# Patient Record
Sex: Female | Born: 1935 | Race: White | Marital: Married | Smoking: Never smoker
Health system: Southern US, Community
[De-identification: ages and names within clinical notes are randomized; demographics above are authoritative.]

## PROBLEM LIST (undated history)

## (undated) DIAGNOSIS — R51 Headache: Secondary | ICD-10-CM

## (undated) DIAGNOSIS — F419 Anxiety disorder, unspecified: Secondary | ICD-10-CM

## (undated) DIAGNOSIS — F329 Major depressive disorder, single episode, unspecified: Secondary | ICD-10-CM

## (undated) DIAGNOSIS — E119 Type 2 diabetes mellitus without complications: Secondary | ICD-10-CM

## (undated) DIAGNOSIS — I10 Essential (primary) hypertension: Secondary | ICD-10-CM

## (undated) DIAGNOSIS — G43909 Migraine, unspecified, not intractable, without status migrainosus: Secondary | ICD-10-CM

## (undated) DIAGNOSIS — E7800 Pure hypercholesterolemia, unspecified: Secondary | ICD-10-CM

## (undated) DIAGNOSIS — R55 Syncope and collapse: Secondary | ICD-10-CM

## (undated) DIAGNOSIS — H3530 Unspecified macular degeneration: Secondary | ICD-10-CM

## (undated) DIAGNOSIS — F32A Depression, unspecified: Secondary | ICD-10-CM

## (undated) HISTORY — DX: Essential (primary) hypertension: I10

## (undated) HISTORY — DX: Type 2 diabetes mellitus without complications: E11.9

## (undated) HISTORY — PX: ABDOMINAL HYSTERECTOMY: SHX81

## (undated) HISTORY — DX: Major depressive disorder, single episode, unspecified: F32.9

## (undated) HISTORY — PX: APPENDECTOMY: SHX54

## (undated) HISTORY — DX: Headache: R51

## (undated) HISTORY — DX: Migraine, unspecified, not intractable, without status migrainosus: G43.909

## (undated) HISTORY — PX: CATARACT EXTRACTION: SUR2

## (undated) HISTORY — DX: Depression, unspecified: F32.A

## (undated) HISTORY — DX: Unspecified macular degeneration: H35.30

## (undated) HISTORY — PX: CAROTID ARTERY ANGIOPLASTY: SHX1300

## (undated) HISTORY — DX: Syncope and collapse: R55

## (undated) HISTORY — DX: Pure hypercholesterolemia, unspecified: E78.00

## (undated) HISTORY — DX: Anxiety disorder, unspecified: F41.9

## (undated) HISTORY — PX: LUMBAR LAMINECTOMY: SHX95

---

## 2015-06-09 ENCOUNTER — Encounter: Payer: Self-pay | Admitting: Neurology

## 2015-06-09 ENCOUNTER — Ambulatory Visit (INDEPENDENT_AMBULATORY_CARE_PROVIDER_SITE_OTHER): Payer: Medicare Other | Admitting: Neurology

## 2015-06-09 VITALS — BP 102/60 | HR 64 | Ht 64.5 in | Wt 155.0 lb

## 2015-06-09 DIAGNOSIS — R51 Headache: Secondary | ICD-10-CM

## 2015-06-09 DIAGNOSIS — R519 Headache, unspecified: Secondary | ICD-10-CM

## 2015-06-09 DIAGNOSIS — G441 Vascular headache, not elsewhere classified: Secondary | ICD-10-CM

## 2015-06-09 DIAGNOSIS — R55 Syncope and collapse: Secondary | ICD-10-CM

## 2015-06-09 HISTORY — DX: Syncope and collapse: R55

## 2015-06-09 HISTORY — DX: Headache, unspecified: R51.9

## 2015-06-09 MED ORDER — GABAPENTIN 100 MG PO CAPS: ORAL_CAPSULE | ORAL | Status: DC

## 2015-06-09 NOTE — Progress Notes (Signed)
Reason for visit: Headache  Referring physician: Dr. Ernestine Mcmurray is a 82 y.o. female  History of present illness:  Ms. Zemanek is an 82 year old right-handed white female with a history of chronic daily headaches that dates back at least 10 years. The patient recalls as she was growing up as a teenager or a young adult that she was having some headaches off and on at that time. The headaches have been much more frequent over the last decade, but particularly over the last year the headaches have increased in severity. The headaches are bifrontal in nature, and may project backwards into the occipital area of the head. The patient denies any neck pain or neck stiffness. She reports some phonophobia with the headache, no photophobia. The patient denies any nausea or vomiting or any numbness or weakness with the headache. The patient does have some blurring of vision, but she also has bilateral macular degeneration which impairs her vision. She also reports issues with syncope that date back at least 2 years. The patient has had increasing frequency of syncope or near syncope. She indicates that when she lies down she feels better. The patient may feel lightheaded, then she may black out. She denies any diaphoresis or nausea with these events. The last such event was within the last 2 weeks prior to this evaluation. The patient has checked blood sugars around these events, they have not been too high or too low, running in the 130 to 150 range. The patient indicates that she had a head scan within the last 6 months at [facility]. She was told that this was unremarkable, but the report is not available to me. She does have a history of vertigo at times. She has a history of cerebrovascular disease, she has had a prior right carotid endarterectomy. The patient denies any problems controlling the bowels or the bladder, she denies any low back pain. She is sent to this office for an  evaluation. She claims that she takes only Tylenol for the headache, the headaches tend to be worse in the morning. She indicates that she has never been placed on a daily medication for her headache. She also goes on to say that she has a memory problem. She does not operate a motor vehicle.  Past Medical History  Diagnosis Date  . Hypertension   . Diabetes (HCC)   . High cholesterol   . Migraine   . Depression   . Anxiety   . Syncope and collapse 06/09/2015  . Headache 06/09/2015  . Macular degeneration     Past Surgical History  Procedure Laterality Date  . Abdominal hysterectomy    . Carotid artery angioplasty Right   . Cataract extraction Bilateral   . Appendectomy    . Lumbar laminectomy      Family History  Problem Relation Age of Onset  . Prostate cancer Father   . Heart disease Father   . Dementia Brother   . Emphysema Brother     Social history:  reports that she has never smoked. She does not have any smokeless tobacco history on file. She reports that she does not drink alcohol or use illicit drugs.  Medications:  Prior to Admission medications   Medication Sig Start Date End Date Taking? Authorizing Provider  beta carotene w/minerals (OCUVITE) tablet Take 1 tablet by mouth daily.   Yes Historical Provider, MD  Cholecalciferol (VITAMIN D-3 PO) Take 5,000 Int'l Units by mouth daily.  Yes Historical Provider, MD  gabapentin (NEURONTIN) 100 MG capsule One capsule three times a day for 2 weeks, then take 2 capsules three times a day 06/09/15   York Spaniel, MD  JANUVIA 100 MG tablet Take 100 mg by mouth daily. 03/20/15  Yes Historical Provider, MD  LANTUS SOLOSTAR 100 UNIT/ML Solostar Pen Inject 35 Units into the skin at bedtime. 05/12/15  Yes Historical Provider, MD  levothyroxine (SYNTHROID, LEVOTHROID) 50 MCG tablet TAKE 1 TABLET BY MOUTH DAILY ON EMPTY STOMACH WAIT 1 HOUR BEFORE AKING ANY OTHER MEDS/FOOD/DRINK 05/03/15  Yes Historical Provider, MD  lisinopril  (PRINIVIL,ZESTRIL) 40 MG tablet Take 5 mg by mouth daily. 03/20/15  Yes Historical Provider, MD  meclizine (ANTIVERT) 25 MG tablet Take 25 mg by mouth 2 (two) times daily as needed for dizziness.   Yes Historical Provider, MD  Plant Sterols and Stanols (CHOLESTOFF PO) Take by mouth daily.   Yes Historical Provider, MD     No Known Allergies  ROS:  Out of a complete 14 system review of symptoms, the patient complains only of the following symptoms, and all other reviewed systems are negative.  Fatigue Hearing loss, vertigo Moles Blurred vision, double vision, loss of vision, eye pain Shortness of breath Easy bruising Feeling cold, increased thirst Muscle cramps, aching muscles Memory loss, confusion, headache, weakness, slurred speech, dizziness, passing out, tremor Depression, not enough sleep, decreased energy, change in appetite Restless legs  Blood pressure 102/60, pulse 64, height 5' 4.5" (1.638 m), weight 155 lb (70.308 kg).  Physical Exam  General: The patient is alert and cooperative at the time of the examination.  Eyes: Pupils are equal, round, and reactive to light. Discs are flat bilaterally.  Neck: The neck is supple, no carotid bruits are noted on the right, a bruit is noted on the left.  Respiratory: The respiratory examination is clear.  Cardiovascular: The cardiovascular examination reveals a regular rate and rhythm, no obvious murmurs or rubs are noted.  Skin: Extremities are without significant edema.  Neurologic Exam  Mental status: The patient is alert and oriented x 3 at the time of the examination. The patient has apparent normal recent and remote memory, with an apparently normal attention span and concentration ability.  Cranial nerves: Facial symmetry is present. There is good sensation of the face to pinprick and soft touch bilaterally. The strength of the facial muscles and the muscles to head turning and shoulder shrug are normal bilaterally.  Speech is well enunciated, no aphasia or dysarthria is noted. Extraocular movements are full. Visual fields are full. The tongue is midline, and the patient has symmetric elevation of the soft palate. No obvious hearing deficits are noted.  Motor: The motor testing reveals 5 over 5 strength of all 4 extremities. Good symmetric motor tone is noted throughout.  Sensory: Sensory testing is intact to pinprick, soft touch, vibration sensation, and position sense on all 4 extremities, with the exception that there is some decreased vibration sensation on the right foot relative to the left. No evidence of extinction is noted.  Coordination: Cerebellar testing reveals good finger-nose-finger and heel-to-shin bilaterally.  Gait and station: Gait is wide-based, unsteady. The patient normally uses a cane or a walker. Tandem gait was not attempted. Romberg is negative. No drift is seen.  Reflexes: Deep tendon reflexes are symmetric in the arms bilaterally. With the lower extremity, the ankle jerk reflexes are depressed bilaterally, the left knee jerk reflexes depressed relative to the right. Toes are downgoing  bilaterally.   Assessment/Plan:  1. Chronic daily headache  2. History of cerebrovascular disease, left carotid bruit, prior right carotid endarterectomy  3. Recurrent syncope  4. Gait disturbance  5. Reported memory disturbance  The patient has had daily headaches for about 10 years, worse over the last one year. The patient indicates that she has had a head scan procedure done at [facility]. We will try to get the report of this. The patient will be placed on low-dose gabapentin for the headache, given the history of recurrent syncope and carotid bruit, the patient will be sent for a carotid Doppler study and a 2-D echocardiogram. She will have a 30 day cardiac monitor study. She will follow-up in 3 months.  Marlan Palau. Keith Adyn Hoes MD 06/09/2015 1:35 PM  [facility] [address] [place], [place] [id number]  Phone [phone] Fax [phone]

## 2015-06-09 NOTE — Patient Instructions (Signed)
Neurontin (gabapentin) may result in drowsiness, ankle swelling, gait instability, or possibly dizziness. Please contact our office if significant side effects occur with this medication.  Syncope Syncope is a medical term for fainting or passing out. This means you lose consciousness and drop to the ground. People are generally unconscious for less than 5 minutes. You may have some muscle twitches for up to 15 seconds before waking up and returning to normal. Syncope occurs more often in older adults, but it can happen to anyone. While most causes of syncope are not dangerous, syncope can be a sign of a serious medical problem. It is important to seek medical care.  CAUSES  Syncope is caused by a sudden drop in blood flow to the brain. The specific cause is often not determined. Factors that can bring on syncope include:  Taking medicines that lower blood pressure.  Sudden changes in posture, such as standing up quickly.  Taking more medicine than prescribed.  Standing in one place for too long.  Seizure disorders.  Dehydration and excessive exposure to heat.  Low blood sugar (hypoglycemia).  Straining to have a bowel movement.  Heart disease, irregular heartbeat, or other circulatory problems.  Fear, emotional distress, seeing blood, or severe pain. SYMPTOMS  Right before fainting, you may:  Feel dizzy or light-headed.  Feel nauseous.  See all white or all black in your field of vision.  Have cold, clammy skin. DIAGNOSIS  Your health care provider will ask about your symptoms, perform a physical exam, and perform an electrocardiogram (ECG) to record the electrical activity of your heart. Your health care provider may also perform other heart or blood tests to determine the cause of your syncope which may include:  Transthoracic echocardiogram (TTE). During echocardiography, sound waves are used to evaluate how blood flows through your heart.  Transesophageal  echocardiogram (TEE).  Cardiac monitoring. This allows your health care provider to monitor your heart rate and rhythm in real time.  Holter monitor. This is a portable device that records your heartbeat and can help diagnose heart arrhythmias. It allows your health care provider to track your heart activity for several days, if needed.  Stress tests by exercise or by giving medicine that makes the heart beat faster. TREATMENT  In most cases, no treatment is needed. Depending on the cause of your syncope, your health care provider may recommend changing or stopping some of your medicines. HOME CARE INSTRUCTIONS  Have someone stay with you until you feel stable.  Do not drive, use machinery, or play sports until your health care provider says it is okay.  Keep all follow-up appointments as directed by your health care provider.  Lie down right away if you start feeling like you might faint. Breathe deeply and steadily. Wait until all the symptoms have passed.  Drink enough fluids to keep your urine clear or pale yellow.  If you are taking blood pressure or heart medicine, get up slowly and take several minutes to sit and then stand. This can reduce dizziness. SEEK IMMEDIATE MEDICAL CARE IF:   You have a severe headache.  You have unusual pain in the chest, abdomen, or back.  You are bleeding from your mouth or rectum, or you have black or tarry stool.  You have an irregular or very fast heartbeat.  You have pain with breathing.  You have repeated fainting or seizure-like jerking during an episode.  You faint when sitting or lying down.  You have confusion.  You  have trouble walking.  You have severe weakness.  You have vision problems. If you fainted, call your local emergency services (911 in [place] Do not drive yourself to the hospital.    This information is not intended to replace advice given to you by your health care provider. Make sure you discuss any questions  you have with your health care provider.   Document Released: 01/16/2005 Document Revised: 06/02/2014 Document Reviewed: 03/17/2011 Elsevier Interactive Patient Education [employer]2016 [employer]vier Inc.

## 2015-06-11 ENCOUNTER — Telehealth: Payer: Self-pay

## 2015-06-11 NOTE — Telephone Encounter (Signed)
-----   Message from York Spanielharles K Willis, MD sent at 06/09/2015  1:40 PM EDT ----- Please contact [facility], have the most recent CT or MRI the brain scan report faxed to our office. Thank you.

## 2015-06-11 NOTE — Telephone Encounter (Signed)
Called [facility] medical records for most recent CT/MRI results. They asked for faxed request on office letter head. Request was faxed to [phone].

## 2015-06-14 ENCOUNTER — Telehealth: Payer: Self-pay | Admitting: Neurology

## 2015-06-14 NOTE — Telephone Encounter (Signed)
The results of the CT the head done on 12/03/[facility] reveals no acute process, some moderate cerebral atrophy and mild chronic small vessel disease was noted. Some minimal mucosal thickening at the inferior aspects of the maxillary sinuses is seen.

## 2015-06-24 ENCOUNTER — Other Ambulatory Visit: Payer: Self-pay | Admitting: Neurology

## 2015-06-24 ENCOUNTER — Other Ambulatory Visit: Payer: Self-pay

## 2015-06-24 ENCOUNTER — Ambulatory Visit (INDEPENDENT_AMBULATORY_CARE_PROVIDER_SITE_OTHER): Payer: Medicare Other

## 2015-06-24 ENCOUNTER — Ambulatory Visit (HOSPITAL_COMMUNITY)
Admission: RE | Admit: 2015-06-24 | Discharge: 2015-06-24 | Disposition: A | Discharge status: Home / Self Care | Payer: Medicare Other | Source: Ambulatory Visit | Attending: Cardiology | Admitting: Cardiology

## 2015-06-24 ENCOUNTER — Telehealth: Payer: Self-pay | Admitting: Neurology

## 2015-06-24 ENCOUNTER — Ambulatory Visit (HOSPITAL_BASED_OUTPATIENT_CLINIC_OR_DEPARTMENT_OTHER): Payer: Medicare Other

## 2015-06-24 DIAGNOSIS — R55 Syncope and collapse: Secondary | ICD-10-CM

## 2015-06-24 DIAGNOSIS — I340 Nonrheumatic mitral (valve) insufficiency: Secondary | ICD-10-CM | POA: Diagnosis not present

## 2015-06-24 DIAGNOSIS — G441 Vascular headache, not elsewhere classified: Secondary | ICD-10-CM

## 2015-06-24 DIAGNOSIS — I10 Essential (primary) hypertension: Secondary | ICD-10-CM | POA: Insufficient documentation

## 2015-06-24 DIAGNOSIS — E119 Type 2 diabetes mellitus without complications: Secondary | ICD-10-CM | POA: Insufficient documentation

## 2015-06-24 DIAGNOSIS — R0989 Other specified symptoms and signs involving the circulatory and respiratory systems: Secondary | ICD-10-CM

## 2015-06-24 DIAGNOSIS — I6523 Occlusion and stenosis of bilateral carotid arteries: Secondary | ICD-10-CM | POA: Diagnosis not present

## 2015-06-24 NOTE — Telephone Encounter (Signed)
  I called the daughter, the echocardiogram is unremarkable, the carotid Doppler study and a prolonged cardiac monitor study are still pending.  2-D echocardiogram 06/24/15:  Study Conclusions  - Left ventricle: The cavity size was normal. There was mild focal basal hypertrophy of the septum. Systolic function was normal. The estimated ejection fraction was in the range of 55% to 60%. Wall motion was normal; there were no regional wall motion abnormalities. - Aortic valve: Noncoronary cusp mobility was severely restricted. Valve area (VTI): 2.22 cm^2. Valve area (Vmax): 2.04 cm^2. Valve area (Vmean): 2.25 cm^2. - Mitral valve: There was mild to moderate regurgitation directed eccentrically and posteriorly. Valve area by continuity equation (using LVOT flow): 2.83 cm^2. - Atrial septum: No defect or patent foramen ovale was identified. - Pericardium, extracardiac: A trivial pericardial effusion was identified.

## 2015-06-25 ENCOUNTER — Telehealth: Payer: Self-pay | Admitting: Neurology

## 2015-06-25 NOTE — Telephone Encounter (Signed)
   I called, the carotid Doppler study does show bilateral stenosis, somewhat more prominent on the left, but the stenosis is not critical. We will follow for now.  Carotid Doppler study 06/24/15:  Carotid Doppler study shows 40-59% stenosis on the right, this the side of the carotid endarterectomy. Left side shows 60-79% stenosis, vertebral artery flow is normal.   We will follow the carotid Doppler, check in one year.

## 2015-07-29 ENCOUNTER — Telehealth: Payer: Self-pay | Admitting: Neurology

## 2015-07-29 NOTE — Telephone Encounter (Signed)
I called the patient, talk with the daughter. The patient had a cardiac monitor study that was unremarkable. The patient had 2 or 3 episodes of unresponsiveness while on the monitor, no correlate with a heart rhythm is seen. The patient is feeling some better with the headaches, still feels dizzy in the morning, with a headache on the top of the head. She is felt much better over the last 1 week. We will follow-up in 6 weeks, if the episodes continue, we may consider a low dose of Keppra as an empiric trial for the episodes of unresponsiveness.

## 2015-09-09 ENCOUNTER — Ambulatory Visit (INDEPENDENT_AMBULATORY_CARE_PROVIDER_SITE_OTHER): Payer: Medicare Other | Admitting: Neurology

## 2015-09-09 ENCOUNTER — Encounter: Payer: Self-pay | Admitting: Neurology

## 2015-09-09 VITALS — BP 172/78 | HR 65 | Ht 64.5 in | Wt 160.5 lb

## 2015-09-09 DIAGNOSIS — I779 Disorder of arteries and arterioles, unspecified: Secondary | ICD-10-CM

## 2015-09-09 DIAGNOSIS — I739 Peripheral vascular disease, unspecified: Secondary | ICD-10-CM

## 2015-09-09 DIAGNOSIS — R55 Syncope and collapse: Secondary | ICD-10-CM | POA: Diagnosis not present

## 2015-09-09 DIAGNOSIS — G441 Vascular headache, not elsewhere classified: Secondary | ICD-10-CM | POA: Diagnosis not present

## 2015-09-09 DIAGNOSIS — R413 Other amnesia: Secondary | ICD-10-CM | POA: Insufficient documentation

## 2015-09-09 MED ORDER — GABAPENTIN 300 MG PO CAPS: 300.0000 mg | ORAL_CAPSULE | Freq: Two times a day (BID) | ORAL | 3 refills | Status: DC

## 2015-09-09 NOTE — Patient Instructions (Signed)
   We will go up on the gabapentin to 300 mg twice a day. MRI of the brain will be done.

## 2015-09-09 NOTE — Progress Notes (Signed)
Reason for visit:  Headache, dizziness  Valerie Arnold is an 82 y.o. female  History of present illness:   Valerie Arnold is an 82 year old right-handed white female with a history of cerebrovascular disease, bilateral carotid artery stenosis. The patient has a greater than 10 year history of chronic daily headaches. She has reported increasing problems with dizziness , episodes of near-syncope and syncope. The patient has undergone a workup that has included a CT scan of the brain done at [facility], this was unremarkable. The patient has undergone carotid Doppler studies that show 40-59% stenosis on the right, 60-79 percent stenosis on the left. Vertebral artery flow appeared to be normal. The patient has undergone a 2-D echocardiogram that was unremarkable, and a cardiac event monitor showed no cardiac rhythm abnormalities even during episodes of syncope or near-syncope. The patient has had an occasional fall since last seen. The patient was placed on gabapentin for the headache which seems to be beneficial, but she only worked up to taking 200 mg twice daily. She returns to this office for an evaluation.  Past Medical History:  Diagnosis Date  . Anxiety   . Depression   . Diabetes (HCC)   . Headache 06/09/2015  . High cholesterol   . Hypertension   . Macular degeneration   . Migraine   . Syncope and collapse 06/09/2015    Past Surgical History:  Procedure Laterality Date  . ABDOMINAL HYSTERECTOMY    . APPENDECTOMY    . CAROTID ARTERY ANGIOPLASTY Right   . CATARACT EXTRACTION Bilateral   . LUMBAR LAMINECTOMY      Family History  Problem Relation Age of Onset  . Prostate cancer Father   . Heart disease Father   . Dementia Brother   . Emphysema Brother     Social history:  reports that she has never smoked. She has never used smokeless tobacco. She reports that she does not drink alcohol or use drugs.   No Known Allergies  Medications:  Prior to Admission  medications   Medication Sig Start Date End Date Taking? Authorizing Provider  beta carotene w/minerals (OCUVITE) tablet Take 1 tablet by mouth daily.   Yes Historical Provider, MD  Cholecalciferol (VITAMIN D-3 PO) Take 5,000 Int'l Units by mouth daily.   Yes Historical Provider, MD  gabapentin (NEURONTIN) 100 MG capsule One capsule three times a day for 2 weeks, then take 2 capsules three times a day 06/09/15  Yes York Spaniel, MD  JANUVIA 100 MG tablet Take 100 mg by mouth daily. 03/20/15  Yes Historical Provider, MD  LANTUS SOLOSTAR 100 UNIT/ML Solostar Pen Inject 35 Units into the skin at bedtime. 05/12/15  Yes Historical Provider, MD  levothyroxine (SYNTHROID, LEVOTHROID) 50 MCG tablet TAKE 1 TABLET BY MOUTH DAILY ON EMPTY STOMACH WAIT 1 HOUR BEFORE AKING ANY OTHER MEDS/FOOD/DRINK 05/03/15  Yes Historical Provider, MD  lisinopril (PRINIVIL,ZESTRIL) 5 MG tablet  07/30/15  Yes Historical Provider, MD  meclizine (ANTIVERT) 25 MG tablet Take 25 mg by mouth 2 (two) times daily as needed for dizziness.   Yes Historical Provider, MD  Plant Sterols and Stanols (CHOLESTOFF PO) Take by mouth daily.   Yes Historical Provider, MD    ROS:  Out of a complete 14 system review of symptoms, the patient complains only of the following symptoms, and all other reviewed systems are negative.   light sensitivity, loss of vision, blurred vision  Daytime sleepiness  Back pain, achy muscles, muscle cramps, walking difficulty  Moles  Memory loss, dizziness, headache, numbness, speech difficulty, weakness, tremors, passing out  Agitation , confusion, depression, anxiety, hallucinations  Blood pressure (!) 172/78, pulse 65, height 5' 4.5" (1.638 m), weight 160 lb 8 oz (72.8 kg).  Physical Exam  General: The patient is alert and cooperative at the time of the examination.  Skin: No significant peripheral edema is noted.   Neurologic Exam  Mental status: The patient is alert and oriented x 2 at the time of  the examination ( not oriented to date). The  Mini-Mental status examination done today shows a total score 23/30.   Cranial nerves: Facial symmetry is present. Speech is normal, no aphasia or dysarthria is noted. Extraocular movements are full. Visual fields are full, but the patient has poor macular vision.  Motor: The patient has good strength in all 4 extremities.  Sensory examination: Soft touch sensation is symmetric on the face, arms, and legs.  Coordination: The patient has good finger-nose-finger and heel-to-shin bilaterally.  Gait and station: The patient has a wide-based gait, the patient walks with a cane or a walker. Romberg is negative. Tandem gait was not attempted.  Reflexes: Deep tendon reflexes are symmetric.   Assessment/Plan:   1. Chronic daily headache   2. Dizziness, near syncope and syncope   3. Gait disturbance   4. Cerebrovascular disease, bilateral carotid stenosis   5. Memory disturbance   The patient will be sent back for MRI evaluation of the brain. The gabapentin will be increased to 300 milligrams twice daily. The patient will follow-up in 4 months for an evaluation. We will follow the carotid Doppler study over time. The memory will be followed over time as well.  Valerie Palau. Keith Kemani Demarais MD 09/09/2015 7:17 PM  [facility] [address] [place], [place] [id number]  Phone [phone] Fax [phone]

## 2015-09-13 DIAGNOSIS — E785 Hyperlipidemia, unspecified: Secondary | ICD-10-CM | POA: Insufficient documentation

## 2015-09-13 DIAGNOSIS — E559 Vitamin D deficiency, unspecified: Secondary | ICD-10-CM | POA: Insufficient documentation

## 2015-09-13 DIAGNOSIS — E039 Hypothyroidism, unspecified: Secondary | ICD-10-CM | POA: Insufficient documentation

## 2015-09-13 DIAGNOSIS — N182 Chronic kidney disease, stage 2 (mild): Secondary | ICD-10-CM | POA: Insufficient documentation

## 2015-09-13 DIAGNOSIS — Z794 Long term (current) use of insulin: Secondary | ICD-10-CM | POA: Insufficient documentation

## 2015-09-13 DIAGNOSIS — E1165 Type 2 diabetes mellitus with hyperglycemia: Secondary | ICD-10-CM | POA: Insufficient documentation

## 2016-02-04 ENCOUNTER — Ambulatory Visit: Payer: Medicare Other | Admitting: Neurology

## 2016-02-27 DIAGNOSIS — N179 Acute kidney failure, unspecified: Secondary | ICD-10-CM

## 2016-02-27 DIAGNOSIS — E039 Hypothyroidism, unspecified: Secondary | ICD-10-CM

## 2016-02-27 DIAGNOSIS — N390 Urinary tract infection, site not specified: Secondary | ICD-10-CM

## 2016-02-27 DIAGNOSIS — I4510 Unspecified right bundle-branch block: Secondary | ICD-10-CM

## 2016-02-27 DIAGNOSIS — I214 Non-ST elevation (NSTEMI) myocardial infarction: Secondary | ICD-10-CM

## 2016-02-27 DIAGNOSIS — E872 Acidosis: Secondary | ICD-10-CM

## 2016-02-27 DIAGNOSIS — E785 Hyperlipidemia, unspecified: Secondary | ICD-10-CM

## 2016-02-27 DIAGNOSIS — E860 Dehydration: Secondary | ICD-10-CM

## 2016-02-27 DIAGNOSIS — R748 Abnormal levels of other serum enzymes: Secondary | ICD-10-CM | POA: Diagnosis not present

## 2016-02-27 DIAGNOSIS — I252 Old myocardial infarction: Secondary | ICD-10-CM

## 2016-02-27 DIAGNOSIS — G9349 Other encephalopathy: Secondary | ICD-10-CM

## 2016-02-27 DIAGNOSIS — I10 Essential (primary) hypertension: Secondary | ICD-10-CM

## 2016-02-27 DIAGNOSIS — Z87898 Personal history of other specified conditions: Secondary | ICD-10-CM

## 2016-02-28 DIAGNOSIS — I214 Non-ST elevation (NSTEMI) myocardial infarction: Secondary | ICD-10-CM | POA: Diagnosis not present

## 2016-02-28 DIAGNOSIS — N390 Urinary tract infection, site not specified: Secondary | ICD-10-CM | POA: Diagnosis not present

## 2016-02-28 DIAGNOSIS — R748 Abnormal levels of other serum enzymes: Secondary | ICD-10-CM | POA: Diagnosis not present

## 2016-02-28 DIAGNOSIS — G9349 Other encephalopathy: Secondary | ICD-10-CM | POA: Diagnosis not present

## 2016-02-29 DIAGNOSIS — I214 Non-ST elevation (NSTEMI) myocardial infarction: Secondary | ICD-10-CM | POA: Diagnosis not present

## 2016-02-29 DIAGNOSIS — R748 Abnormal levels of other serum enzymes: Secondary | ICD-10-CM | POA: Diagnosis not present

## 2016-02-29 DIAGNOSIS — N390 Urinary tract infection, site not specified: Secondary | ICD-10-CM | POA: Diagnosis not present

## 2016-02-29 DIAGNOSIS — G9349 Other encephalopathy: Secondary | ICD-10-CM | POA: Diagnosis not present

## 2016-03-01 ENCOUNTER — Encounter (HOSPITAL_COMMUNITY): Payer: Self-pay | Admitting: Family Medicine

## 2016-03-01 ENCOUNTER — Inpatient Hospital Stay (HOSPITAL_COMMUNITY)
Admission: EM | Admit: 2016-03-01 | Discharge: 2016-03-06 | DRG: 064 | Disposition: A | Discharge status: Skilled Nursing Facility | Payer: Medicare Other | Source: Other Acute Inpatient Hospital | Attending: Internal Medicine | Admitting: Internal Medicine

## 2016-03-01 DIAGNOSIS — J690 Pneumonitis due to inhalation of food and vomit: Secondary | ICD-10-CM | POA: Diagnosis present

## 2016-03-01 DIAGNOSIS — E039 Hypothyroidism, unspecified: Secondary | ICD-10-CM | POA: Diagnosis not present

## 2016-03-01 DIAGNOSIS — I639 Cerebral infarction, unspecified: Secondary | ICD-10-CM | POA: Diagnosis not present

## 2016-03-01 DIAGNOSIS — I10 Essential (primary) hypertension: Secondary | ICD-10-CM | POA: Diagnosis not present

## 2016-03-01 DIAGNOSIS — I6340 Cerebral infarction due to embolism of unspecified cerebral artery: Secondary | ICD-10-CM | POA: Diagnosis present

## 2016-03-01 DIAGNOSIS — E1122 Type 2 diabetes mellitus with diabetic chronic kidney disease: Secondary | ICD-10-CM | POA: Diagnosis present

## 2016-03-01 DIAGNOSIS — E876 Hypokalemia: Secondary | ICD-10-CM | POA: Diagnosis not present

## 2016-03-01 DIAGNOSIS — N390 Urinary tract infection, site not specified: Secondary | ICD-10-CM | POA: Diagnosis present

## 2016-03-01 DIAGNOSIS — I959 Hypotension, unspecified: Secondary | ICD-10-CM | POA: Diagnosis present

## 2016-03-01 DIAGNOSIS — R5381 Other malaise: Secondary | ICD-10-CM | POA: Diagnosis present

## 2016-03-01 DIAGNOSIS — I6789 Other cerebrovascular disease: Secondary | ICD-10-CM | POA: Diagnosis not present

## 2016-03-01 DIAGNOSIS — I214 Non-ST elevation (NSTEMI) myocardial infarction: Secondary | ICD-10-CM | POA: Diagnosis present

## 2016-03-01 DIAGNOSIS — I779 Disorder of arteries and arterioles, unspecified: Secondary | ICD-10-CM | POA: Diagnosis not present

## 2016-03-01 DIAGNOSIS — Z66 Do not resuscitate: Secondary | ICD-10-CM | POA: Diagnosis present

## 2016-03-01 DIAGNOSIS — F329 Major depressive disorder, single episode, unspecified: Secondary | ICD-10-CM | POA: Diagnosis present

## 2016-03-01 DIAGNOSIS — E118 Type 2 diabetes mellitus with unspecified complications: Secondary | ICD-10-CM

## 2016-03-01 DIAGNOSIS — R079 Chest pain, unspecified: Secondary | ICD-10-CM | POA: Diagnosis not present

## 2016-03-01 DIAGNOSIS — R0602 Shortness of breath: Secondary | ICD-10-CM

## 2016-03-01 DIAGNOSIS — R748 Abnormal levels of other serum enzymes: Secondary | ICD-10-CM | POA: Diagnosis not present

## 2016-03-01 DIAGNOSIS — G9340 Encephalopathy, unspecified: Secondary | ICD-10-CM | POA: Diagnosis present

## 2016-03-01 DIAGNOSIS — I4891 Unspecified atrial fibrillation: Secondary | ICD-10-CM | POA: Diagnosis present

## 2016-03-01 DIAGNOSIS — Z79899 Other long term (current) drug therapy: Secondary | ICD-10-CM

## 2016-03-01 DIAGNOSIS — H3530 Unspecified macular degeneration: Secondary | ICD-10-CM | POA: Diagnosis present

## 2016-03-01 DIAGNOSIS — E119 Type 2 diabetes mellitus without complications: Secondary | ICD-10-CM

## 2016-03-01 DIAGNOSIS — I6523 Occlusion and stenosis of bilateral carotid arteries: Secondary | ICD-10-CM | POA: Diagnosis present

## 2016-03-01 DIAGNOSIS — I480 Paroxysmal atrial fibrillation: Secondary | ICD-10-CM | POA: Diagnosis present

## 2016-03-01 DIAGNOSIS — I358 Other nonrheumatic aortic valve disorders: Secondary | ICD-10-CM | POA: Diagnosis not present

## 2016-03-01 DIAGNOSIS — B3789 Other sites of candidiasis: Secondary | ICD-10-CM | POA: Diagnosis present

## 2016-03-01 DIAGNOSIS — I129 Hypertensive chronic kidney disease with stage 1 through stage 4 chronic kidney disease, or unspecified chronic kidney disease: Secondary | ICD-10-CM | POA: Diagnosis present

## 2016-03-01 DIAGNOSIS — Y95 Nosocomial condition: Secondary | ICD-10-CM

## 2016-03-01 DIAGNOSIS — I2510 Atherosclerotic heart disease of native coronary artery without angina pectoris: Secondary | ICD-10-CM | POA: Diagnosis present

## 2016-03-01 DIAGNOSIS — R339 Retention of urine, unspecified: Secondary | ICD-10-CM | POA: Diagnosis not present

## 2016-03-01 DIAGNOSIS — F419 Anxiety disorder, unspecified: Secondary | ICD-10-CM | POA: Diagnosis present

## 2016-03-01 DIAGNOSIS — I739 Peripheral vascular disease, unspecified: Secondary | ICD-10-CM

## 2016-03-01 DIAGNOSIS — B370 Candidal stomatitis: Secondary | ICD-10-CM | POA: Diagnosis present

## 2016-03-01 DIAGNOSIS — J189 Pneumonia, unspecified organism: Secondary | ICD-10-CM | POA: Diagnosis not present

## 2016-03-01 DIAGNOSIS — E785 Hyperlipidemia, unspecified: Secondary | ICD-10-CM | POA: Diagnosis present

## 2016-03-01 DIAGNOSIS — N182 Chronic kidney disease, stage 2 (mild): Secondary | ICD-10-CM | POA: Diagnosis not present

## 2016-03-01 DIAGNOSIS — Z794 Long term (current) use of insulin: Secondary | ICD-10-CM | POA: Diagnosis not present

## 2016-03-01 DIAGNOSIS — R1311 Dysphagia, oral phase: Secondary | ICD-10-CM | POA: Diagnosis present

## 2016-03-01 DIAGNOSIS — I2583 Coronary atherosclerosis due to lipid rich plaque: Secondary | ICD-10-CM | POA: Diagnosis not present

## 2016-03-01 DIAGNOSIS — R4182 Altered mental status, unspecified: Secondary | ICD-10-CM | POA: Diagnosis not present

## 2016-03-01 DIAGNOSIS — R1310 Dysphagia, unspecified: Secondary | ICD-10-CM

## 2016-03-01 DIAGNOSIS — G9349 Other encephalopathy: Secondary | ICD-10-CM | POA: Diagnosis not present

## 2016-03-01 LAB — GLUCOSE, CAPILLARY: Glucose-Capillary: 203 mg/dL — ABNORMAL HIGH (ref 65–99)

## 2016-03-01 MED ORDER — STROKE: EARLY STAGES OF RECOVERY BOOK
Freq: Once | Status: AC
  Administered 2016-03-02

## 2016-03-01 MED ORDER — LEVOTHYROXINE SODIUM 50 MCG PO TABS
50.0000 ug | ORAL_TABLET | Freq: Every day | ORAL | Status: DC
  Administered 2016-03-03 – 2016-03-06 (×4): 50 ug via ORAL
  Filled 2016-03-01 (×4): qty 1

## 2016-03-01 MED ORDER — MORPHINE SULFATE (PF) 2 MG/ML IV SOLN: 1.0000 mg | INTRAVENOUS | Status: DC | PRN

## 2016-03-01 MED ORDER — MECLIZINE HCL 12.5 MG PO TABS: 25.0000 mg | ORAL_TABLET | Freq: Two times a day (BID) | ORAL | Status: DC | PRN

## 2016-03-01 MED ORDER — PLANT STEROLS AND STANOLS 450 MG PO TABS: 1.0000 | ORAL_TABLET | Freq: Every day | ORAL | Status: DC

## 2016-03-01 MED ORDER — PRAVASTATIN SODIUM 20 MG PO TABS: 20.0000 mg | ORAL_TABLET | Freq: Every day | ORAL | Status: DC

## 2016-03-01 MED ORDER — INSULIN GLARGINE 100 UNIT/ML ~~LOC~~ SOLN
30.0000 [IU] | Freq: Every day | SUBCUTANEOUS | Status: DC
  Administered 2016-03-02 – 2016-03-03 (×3): 30 [IU] via SUBCUTANEOUS
  Filled 2016-03-01 (×3): qty 0.3

## 2016-03-01 MED ORDER — ACETAMINOPHEN 650 MG RE SUPP
650.0000 mg | RECTAL | Status: DC | PRN
  Administered 2016-03-02: 650 mg via RECTAL
  Filled 2016-03-01: qty 1

## 2016-03-01 MED ORDER — ACETAMINOPHEN 325 MG PO TABS: 650.0000 mg | ORAL_TABLET | ORAL | Status: DC | PRN

## 2016-03-01 MED ORDER — NYSTATIN 100000 UNIT/ML MT SUSP
5.0000 mL | Freq: Four times a day (QID) | OROMUCOSAL | Status: DC
  Administered 2016-03-02 – 2016-03-06 (×17): 500000 [IU] via OROMUCOSAL
  Filled 2016-03-01 (×20): qty 5

## 2016-03-01 MED ORDER — GABAPENTIN 300 MG PO CAPS
300.0000 mg | ORAL_CAPSULE | Freq: Two times a day (BID) | ORAL | Status: DC
  Administered 2016-03-02 – 2016-03-06 (×9): 300 mg via ORAL
  Filled 2016-03-01 (×9): qty 1

## 2016-03-01 MED ORDER — ACETAMINOPHEN 160 MG/5ML PO SOLN: 650.0000 mg | ORAL | Status: DC | PRN

## 2016-03-01 MED ORDER — FLUCONAZOLE IN SODIUM CHLORIDE 400-0.9 MG/200ML-% IV SOLN
400.0000 mg | Freq: Once | INTRAVENOUS | Status: AC
  Administered 2016-03-02: 400 mg via INTRAVENOUS
  Filled 2016-03-01: qty 200

## 2016-03-01 MED ORDER — ASPIRIN 325 MG PO TABS
325.0000 mg | ORAL_TABLET | Freq: Every day | ORAL | Status: DC
  Administered 2016-03-02 – 2016-03-03 (×2): 325 mg via ORAL
  Filled 2016-03-01 (×2): qty 1

## 2016-03-01 MED ORDER — ENOXAPARIN SODIUM 40 MG/0.4ML ~~LOC~~ SOLN: 40.0000 mg | SUBCUTANEOUS | Status: DC

## 2016-03-01 MED ORDER — INSULIN ASPART 100 UNIT/ML ~~LOC~~ SOLN
0.0000 [IU] | SUBCUTANEOUS | Status: DC
  Administered 2016-03-02: 3 [IU] via SUBCUTANEOUS
  Administered 2016-03-02: 5 [IU] via SUBCUTANEOUS
  Administered 2016-03-03: 2 [IU] via SUBCUTANEOUS
  Administered 2016-03-03 – 2016-03-04 (×3): 1 [IU] via SUBCUTANEOUS
  Administered 2016-03-05: 3 [IU] via SUBCUTANEOUS
  Administered 2016-03-05: 2 [IU] via SUBCUTANEOUS
  Administered 2016-03-06: 3 [IU] via SUBCUTANEOUS
  Administered 2016-03-06: 2 [IU] via SUBCUTANEOUS

## 2016-03-01 MED ORDER — VITAMIN D 1000 UNITS PO TABS
5000.0000 [IU] | ORAL_TABLET | Freq: Every day | ORAL | Status: DC
  Administered 2016-03-02 – 2016-03-06 (×5): 5000 [IU] via ORAL
  Filled 2016-03-01 (×6): qty 5

## 2016-03-01 MED ORDER — SENNOSIDES-DOCUSATE SODIUM 8.6-50 MG PO TABS: 1.0000 | ORAL_TABLET | Freq: Every evening | ORAL | Status: DC | PRN

## 2016-03-01 MED ORDER — SODIUM CHLORIDE 0.9 % IV SOLN
INTRAVENOUS | Status: DC
  Administered 2016-03-01: via INTRAVENOUS

## 2016-03-01 NOTE — H&P (Signed)
History and Physical    Valerie Arnold ZOX:[id number] DOB: Jun 29, 1927 DOA: 03/01/2016  PCP: Anselmo Pickler, MD   Patient coming from: [facility]   Chief Complaint: Ischemic stroke  HPI: Valerie Arnold is a 83 y.o. female with medical history significant for insulin-dependent diabetes mellitus, anxiety, depression, and hypertension who presented to [facility] emergency department on 02/27/2016 for evaluation of several days of generalized weakness, altered mental status, and hypotension.  She was found to have a troponin in the 6-range and UTI. Cardiology was consulted and the patient was anticoagulated with Lovenox with a conservative approach favored by cardiology there. Troponin trended back down and patient was eventually transitioned to subcutaneous Lovenox and was started on aspirin, beta blocker, statin, and ranolazine.   Patient's mental status change did not improve with treatment of her UTI and MRI of the brain was performed on 02/28/16, revealing an acute 3-4 mm infarct in the right frontoparietal junction without hemorrhage or mass effect. Neurology was consulted and it was advised that the patient be treated with aspirin for now, and then changed to Plavix once she is more stable. During evaluation of the patient's CVA, carotid Dopplers were performed, revealing high-grade left carotid bifurcation stenosis involving the proximal ICA with near occlusion, as well as mild diffuse disease on the right. Vascular surgery at [facility] was consulted regarding the high-grade carotid stenosis, but it was advised that the patient will need to wait at least 2-3 weeks for consideration of surgery given the acute MI and acute stroke.   Patient was under the care of hospitalists at [facility] with neurology consulted by telemedicine. Patient's family requested transfer to [facility] for on-site neurology. Transfer was arranged on 02/28/2016, but bed was not immediately  available and the patient continued to wait at [facility].   On 02/29/2016, patient had acute respiratory symptoms and chest x-ray revealed new opacity in the right lower lobe consistent with pneumonia or aspiration. Patient's daughter at the bedside and notes that there has been difficulty with swallowing and the patient has been choking. Patient was treated with Zosyn for the suspected aspiration pneumonia and the UTI. Admission was also notable for development of oropharyngeal candidiasis treated with IV Diflucan and oral nystatin.  Review of Systems:  Unable to obtain complete ROS secondary to the clinical condition.  Past Medical History:  Diagnosis Date  . Anxiety   . Depression   . Diabetes (HCC)   . Headache 06/09/2015  . High cholesterol   . Hypertension   . Macular degeneration   . Migraine   . Syncope and collapse 06/09/2015    Past Surgical History:  Procedure Laterality Date  . ABDOMINAL HYSTERECTOMY    . APPENDECTOMY    . CAROTID ARTERY ANGIOPLASTY Right   . CATARACT EXTRACTION Bilateral   . LUMBAR LAMINECTOMY       reports that she has never smoked. She has never used smokeless tobacco. She reports that she does not drink alcohol or use drugs.  No Known Allergies  Family History  Problem Relation Age of Onset  . Prostate cancer Father   . Heart disease Father   . Dementia Brother   . Emphysema Brother      Prior to Admission medications   Medication Sig Start Date End Date Taking? Authorizing Provider  beta carotene w/minerals (OCUVITE) tablet Take 1 tablet by mouth daily.    Historical Provider, MD  Cholecalciferol (VITAMIN D-3 PO) Take 5,000 Int'l Units by mouth daily.  Historical Provider, MD  gabapentin (NEURONTIN) 300 MG capsule Take 1 capsule (300 mg total) by mouth 2 (two) times daily. 09/09/15   York Spaniel, MD  JANUVIA 100 MG tablet Take 100 mg by mouth daily. 03/20/15   Historical Provider, MD  LANTUS SOLOSTAR 100 UNIT/ML Solostar Pen  Inject 35 Units into the skin at bedtime. 05/12/15   Historical Provider, MD  levothyroxine (SYNTHROID, LEVOTHROID) 50 MCG tablet TAKE 1 TABLET BY MOUTH DAILY ON EMPTY STOMACH WAIT 1 HOUR BEFORE AKING ANY OTHER MEDS/FOOD/DRINK 05/03/15   Historical Provider, MD  lisinopril (PRINIVIL,ZESTRIL) 5 MG tablet  07/30/15   Historical Provider, MD  meclizine (ANTIVERT) 25 MG tablet Take 25 mg by mouth 2 (two) times daily as needed for dizziness.    Historical Provider, MD  Plant Sterols and Stanols (CHOLESTOFF PO) Take by mouth daily.    Historical Provider, MD    Physical Exam: Vitals:   03/01/16 2143  BP: (!) 151/56  Pulse: 90  Resp: 20  Temp: 99.1 F (37.3 C)  TempSrc: Oral  SpO2: 94%  Weight: 76.5 kg (168 lb 9.6 oz)  Height: 5\' 4"  (1.626 m)      Constitutional: No acute distress, calm, comfortable Eyes: PERTLA, lids and conjunctivae normal ENMT: Mucous membranes are dry. Posterior pharynx clear of any exudate or lesions.   Neck: normal, supple, no masses, no thyromegaly Respiratory: Coarse upper airways sounds, rhonchi in bilateral bases. Normal respiratory effort. No accessory muscle use.  Cardiovascular: S1 & S2 heard, regular rate and rhythm, no significant murmur. No significant JVD. Abdomen: No distension, no tenderness, no masses palpated. Bowel sounds normal.  Musculoskeletal: no clubbing / cyanosis. No joint deformity upper and lower extremities.   Skin: no significant rashes, lesions, ulcers. Warm, dry, well-perfused. Neurologic: No gross facial asymmetry, moving all extremities spontaneously. Responds to very simple questioning only. Has difficulty following commands. Patellar DTR normal bilaterally. Babinski down-going bilaterally.  Psychiatric: Difficult to assess given the clinical condition post-CVA.     Labs on Admission: I have personally reviewed following labs and imaging studies  CBC: No results for input(s): WBC, NEUTROABS, HGB, HCT, MCV, PLT in the last 168  hours. Basic Metabolic Panel: No results for input(s): NA, K, CL, CO2, GLUCOSE, BUN, CREATININE, CALCIUM, MG, PHOS in the last 168 hours. GFR: CrCl cannot be calculated (No order found.). Liver Function Tests: No results for input(s): AST, ALT, ALKPHOS, BILITOT, PROT, ALBUMIN in the last 168 hours. No results for input(s): LIPASE, AMYLASE in the last 168 hours. No results for input(s): AMMONIA in the last 168 hours. Coagulation Profile: No results for input(s): INR, PROTIME in the last 168 hours. Cardiac Enzymes: No results for input(s): CKTOTAL, CKMB, CKMBINDEX, TROPONINI in the last 168 hours. BNP (last 3 results) No results for input(s): PROBNP in the last 8760 hours. HbA1C: No results for input(s): HGBA1C in the last 72 hours. CBG: No results for input(s): GLUCAP in the last 168 hours. Lipid Profile: No results for input(s): CHOL, HDL, LDLCALC, TRIG, CHOLHDL, LDLDIRECT in the last 72 hours. Thyroid Function Tests: No results for input(s): TSH, T4TOTAL, FREET4, T3FREE, THYROIDAB in the last 72 hours. Anemia Panel: No results for input(s): VITAMINB12, FOLATE, FERRITIN, TIBC, IRON, RETICCTPCT in the last 72 hours. Urine analysis: No results found for: COLORURINE, APPEARANCEUR, LABSPEC, PHURINE, GLUCOSEU, HGBUR, BILIRUBINUR, KETONESUR, PROTEINUR, UROBILINOGEN, NITRITE, LEUKOCYTESUR Sepsis Labs: @LABRCNTIP (procalcitonin:4,lacticidven:4) )No results found for this or any previous visit (from the past 240 hour(s)).   Radiological Exams on Admission: No  results found.  EKG: Ordered and pending.   Assessment/Plan  1. Ischemic stroke  - Pt presented to OSH on 1/28 with several days of gen weakness and confusion  - Head CT was negative on 02/27/15 and mental-status changes attributed to UTI  - She continued to worsen despite treatment of the UTI and MRI brain obtained 02/28/16 demonstrates acute 3-4 mm ischemic infarct at right frontoparietal jxn without hemorrhage or mass-effect  -  Pt was evaluated by teleneurology at the OSH, started on ASA, advised to switch to Plavix once more stable  - PT, OT, SLP evals requested - Continue ASA and statin pending SLP eval   2. Carotid artery stenosis  - Hx of right CEA, now with near-occlusion on left per dopplers 02/28/16 - Vasc surgery was consulted at OSH and advised she would not be considered for surgery in the next 2-3 wks given she had just suffered NSTEMI - Continue medical management for now    3. Hospital-acquired PNA   - Pt had clear CXR and no respiratory complaints [facility] - On 02/29/2016, she was dyspneic and coughing, requiring supplemental O2, and CXR featured a new RLL infiltrate suspicious for aspiration pneumonia  - She was treated with Zosyn at the OSH  - She is afebrile here, saturating adequately with 2 Lpm, not in respiratory distress  - SLP eval requested, pt kept NPO for now, continue empiric abx   4. CAD   - Pt suffered non-STEMI at the outside hospital, troponin peaking in 6-range before coming down  - She was evaluated by cardiology at the OSH, treated conservatively with treatment-dose Lovenox, ASA, statin, beta-blocker, and nitrates  - There is no anginal complaint on admission here  - She is being monitored on telemetry  - Continue ASA, statin, beta-blocker, nitrates   5. Insulin-dependent DM  - No A1c on file  - Managed with Januvia and Lantus 35 units qHS at home  - Check CBG q4h until she is appropriate for a diet  - Continue Lantus at 30 units qHS with a low-intensity sliding-scale correctional    6. Hypothyroidism  - TSH was wnl at the OSH  - Continue Synthroid   7. Hypertension  - BP at goal on admission  - Continue beta-blocker     DVT prophylaxis: sq Lovenox  Code Status: DNR  Family Communication: Daughter updated at bedside Disposition Plan: Admit to telemetry Consults called: None Admission status: Inpatient    Briscoe Deutscherimothy S Opyd, MD [facility] Pager [phone]  If 7PM-7AM, please contact night-coverage [email or web address] Password [email or web address]  03/01/2016, 11:39 PM

## 2016-03-01 NOTE — Progress Notes (Signed)
Pharmacy Antibiotic Note  Valerie MylarMargie Arnold is a 83 y.o. female [facility] with stroke, concern for pneumonia.  Pharmacy has been consulted for Vancocin and Zosyn dosing.  Pt [facility], changed to Zosyn for aspiration coverage.  [facility] notes mention 1/2 blood cx w/ GPC in clusters.  Labs from OSH: WBC 12.1, Hgb 11.6, Plt 126, SCr 0.9, K 3.8, trop   Home meds: ASA, Ocuvite, Lantus, Synthroid, meclizine, Neurontin, metformin  Plan: Vancomycin 750mg  IV every 12 hours.  Goal trough 15-20 mcg/mL. Zosyn 3.375g IV q8h (4 hour infusion).  Height: 5\' 4"  (162.6 cm) Weight: 168 lb 9.6 oz (76.5 kg) IBW/kg (Calculated) : 54.7  Temp (24hrs), Avg:99.1 F (37.3 C), Min:99.1 F (37.3 C), Max:99.1 F (37.3 C)  No Known Allergies  Thank you for allowing pharmacy to be a part of this patient's care.  Valerie GamblesVeronda Airis Arnold, PharmD, BCPS  03/01/2016 11:49 PM

## 2016-03-02 ENCOUNTER — Encounter (HOSPITAL_COMMUNITY): Payer: Self-pay | Admitting: Family Medicine

## 2016-03-02 ENCOUNTER — Inpatient Hospital Stay (HOSPITAL_COMMUNITY): Payer: Medicare Other

## 2016-03-02 DIAGNOSIS — R4182 Altered mental status, unspecified: Secondary | ICD-10-CM

## 2016-03-02 DIAGNOSIS — J189 Pneumonia, unspecified organism: Secondary | ICD-10-CM

## 2016-03-02 DIAGNOSIS — I779 Disorder of arteries and arterioles, unspecified: Secondary | ICD-10-CM

## 2016-03-02 DIAGNOSIS — J690 Pneumonitis due to inhalation of food and vomit: Secondary | ICD-10-CM

## 2016-03-02 DIAGNOSIS — I2510 Atherosclerotic heart disease of native coronary artery without angina pectoris: Secondary | ICD-10-CM

## 2016-03-02 DIAGNOSIS — I639 Cerebral infarction, unspecified: Secondary | ICD-10-CM

## 2016-03-02 DIAGNOSIS — R079 Chest pain, unspecified: Secondary | ICD-10-CM

## 2016-03-02 DIAGNOSIS — I4891 Unspecified atrial fibrillation: Secondary | ICD-10-CM

## 2016-03-02 DIAGNOSIS — I2583 Coronary atherosclerosis due to lipid rich plaque: Secondary | ICD-10-CM

## 2016-03-02 DIAGNOSIS — I10 Essential (primary) hypertension: Secondary | ICD-10-CM

## 2016-03-02 DIAGNOSIS — I6523 Occlusion and stenosis of bilateral carotid arteries: Secondary | ICD-10-CM

## 2016-03-02 DIAGNOSIS — I6789 Other cerebrovascular disease: Secondary | ICD-10-CM

## 2016-03-02 LAB — ECHOCARDIOGRAM COMPLETE
AOASC: 29 cm
CHL CUP DOP CALC LVOT VTI: 15.3 cm
CHL CUP MV DEC (S): 187
CHL CUP RV SYS PRESS: 34 mmHg
CHL CUP TV REG PEAK VELOCITY: 219 cm/s
E decel time: 187 msec
FS: 19 % — AB (ref 28–44)
HEIGHTINCHES: 64 in
IV/PV OW: 0.92
LA diam index: 1.81 cm/m2
LA vol: 38.5 mL
LASIZE: 33 mm
LAVOLA4C: 31.2 mL
LAVOLIN: 21.2 mL/m2
LDCA: 2.84 cm2
LEFT ATRIUM END SYS DIAM: 33 mm
LV SIMPSON'S DISK: 51
LV dias vol: 109 mL — AB (ref 46–106)
LV e' LATERAL: 3.81 cm/s
LV sys vol index: 29 mL/m2
LVDIAVOLIN: 60 mL/m2
LVOT peak grad rest: 2 mmHg
LVOTD: 19 mm
LVOTPV: 75.4 cm/s
LVOTSV: 43 mL
LVSYSVOL: 53 mL — AB (ref 14–42)
MVPKEVEL: 0.9 m/s
PISA EROA: 0.08 cm2
PW: 13 mm — AB (ref 0.6–1.1)
RV LATERAL S' VELOCITY: 9.9 cm/s
Stroke v: 56 ml
TAPSE: 23.6 mm
TDI e' lateral: 3.81
TDI e' medial: 2.83
TR max vel: 219 cm/s
VTI: 213 cm
WEIGHTICAEL: 2697.6 [oz_av]

## 2016-03-02 LAB — CBC WITH DIFFERENTIAL/PLATELET
BASOS ABS: 0 10*3/uL (ref 0.0–0.1)
Basophils Relative: 0 %
Eosinophils Absolute: 0 10*3/uL (ref 0.0–0.7)
Eosinophils Relative: 0 %
HEMATOCRIT: 33.8 % — AB (ref 36.0–46.0)
Hemoglobin: 11.1 g/dL — ABNORMAL LOW (ref 12.0–15.0)
LYMPHS ABS: 1.9 10*3/uL (ref 0.7–4.0)
LYMPHS PCT: 24 %
MCH: 30.7 pg (ref 26.0–34.0)
MCHC: 32.8 g/dL (ref 30.0–36.0)
MCV: 93.4 fL (ref 78.0–100.0)
MONO ABS: 0.2 10*3/uL (ref 0.1–1.0)
Monocytes Relative: 3 %
NEUTROS ABS: 5.9 10*3/uL (ref 1.7–7.7)
Neutrophils Relative %: 73 %
Platelets: 116 10*3/uL — ABNORMAL LOW (ref 150–400)
RBC: 3.62 MIL/uL — ABNORMAL LOW (ref 3.87–5.11)
RDW: 13.5 % (ref 11.5–15.5)
WBC: 8 10*3/uL (ref 4.0–10.5)

## 2016-03-02 LAB — GLUCOSE, CAPILLARY
GLUCOSE-CAPILLARY: 138 mg/dL — AB (ref 65–99)
GLUCOSE-CAPILLARY: 145 mg/dL — AB (ref 65–99)
Glucose-Capillary: 266 mg/dL — ABNORMAL HIGH (ref 65–99)
Glucose-Capillary: 130 mg/dL — ABNORMAL HIGH (ref 65–99)
Glucose-Capillary: 205 mg/dL — ABNORMAL HIGH (ref 65–99)

## 2016-03-02 LAB — COMPREHENSIVE METABOLIC PANEL
ALT: 18 U/L (ref 14–54)
AST: 35 U/L (ref 15–41)
Albumin: 2.6 g/dL — ABNORMAL LOW (ref 3.5–5.0)
Alkaline Phosphatase: 39 U/L (ref 38–126)
Anion gap: 11 (ref 5–15)
BILIRUBIN TOTAL: 0.9 mg/dL (ref 0.3–1.2)
BUN: 11 mg/dL (ref 6–20)
CO2: 25 mmol/L (ref 22–32)
CREATININE: 0.97 mg/dL (ref 0.44–1.00)
Calcium: 8.1 mg/dL — ABNORMAL LOW (ref 8.9–10.3)
Chloride: 102 mmol/L (ref 101–111)
GFR, EST AFRICAN AMERICAN: 60 mL/min — AB (ref >60)
GFR, EST NON AFRICAN AMERICAN: 51 mL/min — AB (ref >60)
Glucose, Bld: 245 mg/dL — ABNORMAL HIGH (ref 65–99)
POTASSIUM: 3.4 mmol/L — AB (ref 3.5–5.1)
Sodium: 138 mmol/L (ref 135–145)
TOTAL PROTEIN: 5.3 g/dL — AB (ref 6.5–8.1)

## 2016-03-02 LAB — TROPONIN I
Troponin I: 1.52 ng/mL (ref <0.03)
Troponin I: 2.46 ng/mL (ref <0.03)
Troponin I: 2.38 ng/mL (ref <0.03)

## 2016-03-02 LAB — LIPID PANEL
CHOLESTEROL: 161 mg/dL (ref 0–200)
HDL: 27 mg/dL — ABNORMAL LOW (ref >40)
LDL CALC: 100 mg/dL — AB (ref 0–99)
Total CHOL/HDL Ratio: 6 RATIO
Triglycerides: 172 mg/dL — ABNORMAL HIGH (ref <150)
VLDL: 34 mg/dL (ref 0–40)

## 2016-03-02 LAB — HEPARIN LEVEL (UNFRACTIONATED)
HEPARIN UNFRACTIONATED: 0.4 [IU]/mL (ref 0.30–0.70)
Heparin Unfractionated: 0.55 IU/mL (ref 0.30–0.70)

## 2016-03-02 MED ORDER — METOPROLOL TARTRATE 12.5 MG HALF TABLET
12.5000 mg | ORAL_TABLET | Freq: Two times a day (BID) | ORAL | Status: DC
  Administered 2016-03-02 – 2016-03-06 (×8): 12.5 mg via ORAL
  Filled 2016-03-02 (×8): qty 1

## 2016-03-02 MED ORDER — VANCOMYCIN HCL IN DEXTROSE 750-5 MG/150ML-% IV SOLN
750.0000 mg | Freq: Once | INTRAVENOUS | Status: AC
  Administered 2016-03-02: 750 mg via INTRAVENOUS
  Filled 2016-03-02: qty 150

## 2016-03-02 MED ORDER — ATORVASTATIN CALCIUM 40 MG PO TABS
40.0000 mg | ORAL_TABLET | Freq: Every day | ORAL | Status: DC
  Administered 2016-03-02 – 2016-03-05 (×4): 40 mg via ORAL
  Filled 2016-03-02 (×4): qty 1

## 2016-03-02 MED ORDER — PIPERACILLIN-TAZOBACTAM 3.375 G IVPB
3.3750 g | Freq: Three times a day (TID) | INTRAVENOUS | Status: DC
  Administered 2016-03-02 – 2016-03-05 (×10): 3.375 g via INTRAVENOUS
  Filled 2016-03-02 (×11): qty 50

## 2016-03-02 MED ORDER — METOPROLOL TARTRATE 5 MG/5ML IV SOLN
5.0000 mg | INTRAVENOUS | Status: DC | PRN
  Administered 2016-03-02 – 2016-03-06 (×2): 5 mg via INTRAVENOUS
  Filled 2016-03-02 (×3): qty 5

## 2016-03-02 MED ORDER — RANOLAZINE ER 500 MG PO TB12
500.0000 mg | ORAL_TABLET | Freq: Two times a day (BID) | ORAL | Status: DC
  Administered 2016-03-02 – 2016-03-06 (×9): 500 mg via ORAL
  Filled 2016-03-02 (×10): qty 1

## 2016-03-02 MED ORDER — FLUCONAZOLE IN SODIUM CHLORIDE 100-0.9 MG/50ML-% IV SOLN
100.0000 mg | INTRAVENOUS | Status: DC
  Administered 2016-03-03 – 2016-03-05 (×4): 100 mg via INTRAVENOUS
  Filled 2016-03-02 (×5): qty 50

## 2016-03-02 MED ORDER — VANCOMYCIN HCL IN DEXTROSE 750-5 MG/150ML-% IV SOLN
750.0000 mg | Freq: Two times a day (BID) | INTRAVENOUS | Status: DC
  Administered 2016-03-02 – 2016-03-05 (×6): 750 mg via INTRAVENOUS
  Filled 2016-03-02 (×7): qty 150

## 2016-03-02 MED ORDER — HEPARIN (PORCINE) IN NACL 100-0.45 UNIT/ML-% IJ SOLN
850.0000 [IU]/h | INTRAMUSCULAR | Status: AC
  Administered 2016-03-02: 900 [IU]/h via INTRAVENOUS
  Administered 2016-03-03: 850 [IU]/h via INTRAVENOUS
  Filled 2016-03-02 (×2): qty 250

## 2016-03-02 NOTE — Evaluation (Signed)
Clinical/Bedside Swallow Evaluation Patient Details  Name: Valerie Arnold MRN: [id number] Date of Birth: [birth year]  Today's Date: 03/02/2016 Time: SLP Start Time (ACUTE ONLY): 0911 SLP Stop Time (ACUTE ONLY): 0921 SLP Time Calculation (min) (ACUTE ONLY): 10 min  Past Medical History:  Past Medical History:  Diagnosis Date  . Anxiety   . Depression   . Diabetes (HCC)   . Headache 06/09/2015  . High cholesterol   . Hypertension   . Macular degeneration   . Migraine   . Syncope and collapse 06/09/2015   Past Surgical History:  Past Surgical History:  Procedure Laterality Date  . ABDOMINAL HYSTERECTOMY    . APPENDECTOMY    . CAROTID ARTERY ANGIOPLASTY Right   . CATARACT EXTRACTION Bilateral   . LUMBAR LAMINECTOMY     HPI:  Valerie Arnold medical history significant for insulin-dependent diabetes mellitus, anxiety, depression, and hypertension who presented to [facility] emergency department on 02/27/2016 for evaluation of several days of generalized weakness, altered mental status, and hypotension. She was treated for UTI without improved mentation. MRI 1/29 showed acute infarct in the right frontoparietal junction. CXR 1/30 showed new opacity in the RLL consistent with PNA or aspiration. Pt has been treated for oral candidiasis and PNA. Daughter reports coughing and difficulty swallowing solids with odnyophagia.    Assessment / Plan / Recommendation Clinical Impression  Pt has a fairly consistent second swallow with each bolus regardless of texture. Coughing only occurred following trial of soft solid, after she had prolonged mastication and oral residue. No overt s/s of aspiration were observed with thin liquids despite challenging with larger, consecutive straw sips, although she did not drink a full 3 ounces to complete 3 ounce swallow screen. Pt's family at bedside reports less overt signs of difficulty and odynophagia today as compared to previous days. Recommend to  initiate Dys 2 diet and thin liquids with full supervision and careful monitoring for signs of aspiration. Will follow for tolerance and to determine if MBS is warranted.    Aspiration Risk  Mild aspiration risk;Moderate aspiration risk    Diet Recommendation Dysphagia 2 (Fine chop);Thin liquid   Liquid Administration via: Cup;Straw Medication Administration: Whole meds with puree Supervision: Staff to assist with self feeding;Full supervision/cueing for compensatory strategies Compensations: Slow rate;Minimize environmental distractions;Small sips/bites Postural Changes: Seated upright at 90 degrees;Remain upright for at least 30 minutes after po intake    Other  Recommendations Oral Care Recommendations: Oral care BID   Follow up Recommendations  (tba)      Frequency and Duration min 2x/week  2 weeks       Prognosis Prognosis for Safe Diet Advancement: Good Barriers to Reach Goals: Cognitive deficits      Swallow Study   General HPI: Valerie Arnold medical history significant for insulin-dependent diabetes mellitus, anxiety, depression, and hypertension who presented to [facility] emergency department on 02/27/2016 for evaluation of several days of generalized weakness, altered mental status, and hypotension. She was treated for UTI without improved mentation. MRI 1/29 showed acute infarct in the right frontoparietal junction. CXR 1/30 showed new opacity in the RLL consistent with PNA or aspiration. Pt has been treated for oral candidiasis and PNA. Daughter reports coughing and difficulty swallowing solids with odnyophagia.  Type of Study: Bedside Swallow Evaluation Previous Swallow Assessment: none in chart Diet Prior to this Study: NPO Temperature Spikes Noted: Yes (100.2) Respiratory Status: Nasal cannula History of Recent Intubation: No Behavior/Cognition: Alert;Cooperative;Confused;Requires cueing Oral Cavity Assessment: Dry Oral Care Completed by  SLP:  No Oral Cavity - Dentition: Adequate natural dentition Vision: Functional for self-feeding Self-Feeding Abilities: Needs assist Patient Positioning: Upright in bed Baseline Vocal Quality: Low vocal intensity Volitional Swallow: Able to elicit    Oral/Motor/Sensory Function Overall Oral Motor/Sensory Function: Generalized oral weakness   Ice Chips Ice chips: Not tested   Thin Liquid Thin Liquid: Impaired Presentation: Cup;Self Fed;Straw Pharyngeal  Phase Impairments: Multiple swallows    Nectar Thick Nectar Thick Liquid: Not tested   Honey Thick Honey Thick Liquid: Not tested   Puree Puree: Impaired Presentation: Spoon Pharyngeal Phase Impairments: Multiple swallows   Solid   GO   Solid: Impaired Presentation: Self Fed Oral Phase Functional Implications: Oral residue;Impaired mastication Pharyngeal Phase Impairments: Multiple swallows;Cough - Immediate        Valerie Arnold, Soloman Mckeithan 03/02/2016,9:57 AM  Valerie HamLaura Arnold, M.A. CCC-SLP [phone]

## 2016-03-02 NOTE — Progress Notes (Signed)
Echocardiogram 2D Echocardiogram has been performed.  Valerie Arnold 03/02/2016, 3:41 PM

## 2016-03-02 NOTE — Evaluation (Signed)
Speech Language Pathology Evaluation Patient Details Name: Valerie MylarMargie Arnold MRN: [id number] DOB: 07/01/1927 Today's Date: 03/02/2016 Time: [id number] SLP Time Calculation (min) (ACUTE ONLY): 16 min  Problem List:  Patient Active Problem List   Diagnosis Date Noted  . New onset atrial fibrillation (HCC) 03/02/2016  . Atrial fibrillation with RVR (HCC) 03/02/2016  . CAD (coronary artery disease) 03/01/2016  . Ischemic stroke (HCC) 03/01/2016  . UTI (urinary tract infection) 03/01/2016  . HAP (hospital-acquired pneumonia) 03/01/2016  . NSTEMI (non-ST elevated myocardial infarction) (HCC) 03/01/2016  . Type II diabetes mellitus (HCC) 03/01/2016  . Essential hypertension 03/01/2016  . Memory disorder 09/09/2015  . Carotid disease, bilateral (HCC) 09/09/2015  . Syncope and collapse 06/09/2015  . Headache 06/09/2015   Past Medical History:  Past Medical History:  Diagnosis Date  . Anxiety   . Depression   . Diabetes (HCC)   . Headache 06/09/2015  . High cholesterol   . Hypertension   . Macular degeneration   . Migraine   . Syncope and collapse 06/09/2015   Past Surgical History:  Past Surgical History:  Procedure Laterality Date  . ABDOMINAL HYSTERECTOMY    . APPENDECTOMY    . CAROTID ARTERY ANGIOPLASTY Right   . CATARACT EXTRACTION Bilateral   . LUMBAR LAMINECTOMY     HPI:  83 y.o.femalewith medical history significant for insulin-dependent diabetes mellitus, anxiety, depression, and hypertension who [facility] on 02/27/2016 for evaluation of several days of generalized weakness, altered mental status, and hypotension. She was treated for UTI without improved mentation. MRI 1/29 showed acute infarct in the right frontoparietal junction. CXR 1/30 showed new opacity in the RLL consistent with PNA or aspiration. Pt has been treated for oral candidiasis and PNA. Daughter reports coughing and difficulty swallowing solids with odnyophagia.     Assessment / Plan / Recommendation Clinical Impression  Pt has significantly delayed processing time and impaired basic verbal problem solving. Initially upon waking she was not responding to my questions, but she would answer to her daughter. As she became more alert, she was still disoriented to location and time. Mod cues and extra time were provided for completion of one-step commands and initiation of tasks. Pt will need additional SLP f/u to maximize functional cognition.    SLP Assessment  Patient needs continued Speech Lanaguage Pathology Services    Follow Up Recommendations   (tba)    Frequency and Duration min 2x/week  2 weeks      SLP Evaluation Cognition  Overall Cognitive Status: Impaired/Different from baseline Arousal/Alertness: Awake/alert Orientation Level: Oriented to person;Disoriented to place;Disoriented to time Attention: Sustained Sustained Attention: Impaired Sustained Attention Impairment: Verbal basic Awareness: Impaired Awareness Impairment: Intellectual impairment;Emergent impairment Problem Solving: Impaired Problem Solving Impairment: Verbal basic;Functional basic Safety/Judgment: Impaired       Comprehension  Auditory Comprehension Overall Auditory Comprehension: Impaired Commands: Impaired One Step Basic Commands: 50-74% accurate Interfering Components: Attention;Processing speed;Working Radio broadcast assistantmemory EffectiveTechniques: Extra processing time;Repetition    Expression Expression Primary Mode of Expression: Verbal Verbal Expression Overall Verbal Expression: Impaired Initiation: Impaired Automatic Speech: Name Level of Generative/Spontaneous Verbalization: Phrase Naming: No impairment   Oral / Motor  Oral Motor/Sensory Function Overall Oral Motor/Sensory Function: Generalized oral weakness Motor Speech Overall Motor Speech: Appears within functional limits for tasks assessed   GO                    Maxcine Hamaiewonsky, Katilynn Sinkler 03/02/2016, 10:02  AM  Maxcine HamLaura Paiewonsky, M.A. CCC-SLP [phone]

## 2016-03-02 NOTE — Progress Notes (Signed)
ANTICOAGULATION CONSULT NOTE  Pharmacy Consult for heparin Indication: atrial fibrillation  No Known Allergies  Patient Measurements: Height: 5\' 4"  (162.6 cm) Weight: 168 lb 9.6 oz (76.5 kg) IBW/kg (Calculated) : 54.7 Heparin Dosing Weight: 70kg  Vital Signs: Temp: 99.2 F (37.3 C) (02/01 1410) Temp Source: Oral (02/01 1410) BP: 154/54 (02/01 1410) Pulse Rate: 72 (02/01 1410)  Assessment: 83yo female treated w/ medical management for NSTEMI at OSH then transitioned to Px dose of LMWH. [facility]. Heparin drip started for afib and NSTEMI.   Hep now within goal of 0.4  Goal of Therapy:  Heparin level 0.3-0.5 units/ml Monitor platelets by anticoagulation protocol: Yes   Plan:  Continue heparin gtt 850 units/hr Daily heparin level and CBC  Isaac BlissMichael Albertia Carvin, PharmD, BCPS, BCCCP Clinical Pharmacist 03/02/2016 8:43 PM

## 2016-03-02 NOTE — Progress Notes (Signed)
PROGRESS NOTE        PATIENT DETAILS Name: Valerie Arnold Age: 83 y.o. Sex: female Date of Birth: Jan 26, 1928 Admit Date: 03/01/2016 Admitting Physician Briscoe Deutscher, MD [id number], Youlanda Mighty, MD  Brief Narrative: Patient is a 83 y.o. female who was admitted to [facility] on 1/28 for acute encephalopathy and non-STEMI. She was also subsequently found to have acute CVA, hospital course was complicated by aspiration pneumonia. She was subsequently transferred to [facility] for neurology evaluation. See below for further details  Subjective: Appears very weak-washes soft but clear. Nasal chest pain, abdominal pain or shortness of breath  Assessment/Plan: Non-STEMI: No current anginal symptoms. Troponinse down trending. Seen by cardiology at [facility] and plans were to pursue conservative treatment. 2-D echocardiogram showed EF around 55-60% (done at [facility]). Continue aspirin,Ranexa, statin and beta blocker. Defer to cardiology if it is necessary to pursue further risk stratification prior to discharge.  A. fib RVR: Occurred last night-back in sinus rhythm. Started on metoprolol, and heparin per pharmacy.CHADS2VASC of atleast 6.  Acute CVA: Patient was admitted to [facility] for weakness and confusion, initially this was attributed to UTI. CT head was negative, but since confusion persisted-MRI brain was obtained which showed a acute infarct in the right frontoparietal junction. Suspect that this is probably cardioembolic from atrial fibrillation. Although carotid Doppler shows a high grade left carotid bifurcation proximal ICA stenosis with near occlusion-her CVA is in the right cerebral hemisphere. Echocardiogram showed preserved EF, with no obvious embolic source. A1c was 6.6, LDL 105. Currently on aspirin, Lipitor 40 mg, and also on IV heparin. Have consulted neurology for further guidance.  Probable Aspiration PNA:  Continue empiric antibiotics, probable aspiration pneumonitis   High-grade left occipital carotid artery stenosis: Vascular surgery consulted. Note patient has a acute right frontoparietal infarct. This is likely asymptomatic-but probably will require intervention if felt to be an appropriate surgical candidate.  History of insulin-dependent diabetes: CBGs stable, continue 30 units of Lantus daily at bedtime and SSI.  Dyslipidemia: Continue Lipitor 40 mg. Repeat LDL in 3 months  Hypertension: Controlled, continue metoprolol  Hypothyroidism: Continue levothyroxine  Oral thrush: Continue Nystatin and fluconazole  DVT Prophylaxis: Full dose anticoagulation with Heparin  Code Status:  DNR  Family Communication: Daughter at bedside  Disposition Plan: Remain inpatient- SNF on discharge-likey in the next 2 days or so  Antimicrobial agents: Anti-infectives    Start     Dose/Rate Route Frequency Ordered Stop   03/02/16 2300  fluconazole (DIFLUCAN) IVPB 100 mg     100 mg 50 mL/hr over 60 Minutes Intravenous Every 24 hours 03/02/16 0003     03/02/16 1200  vancomycin (VANCOCIN) IVPB 750 mg/150 ml premix     750 mg 150 mL/hr over 60 Minutes Intravenous Every 12 hours 03/02/16 0005     03/02/16 0600  piperacillin-tazobactam (ZOSYN) IVPB 3.375 g     3.375 g 12.5 mL/hr over 240 Minutes Intravenous Every 8 hours 03/02/16 0005     03/02/16 0015  fluconazole (DIFLUCAN) IVPB 400 mg     400 mg 100 mL/hr over 120 Minutes Intravenous  Once 03/01/16 2338 03/02/16 0242   03/02/16 0015  vancomycin (VANCOCIN) IVPB 750 mg/150 ml premix     750 mg 150 mL/hr over 60 Minutes Intravenous  Once 03/02/16 0005 03/02/16 0140  Procedures: nONE  CONSULTS:  cardiology, neurology and vascular surgery  Time spent: 35 minutes-Greater than 50% of this time was spent in counseling, explanation of diagnosis, planning of further management, and coordination of care.  MEDICATIONS: Scheduled Meds: .  aspirin  325 mg Oral Daily  . atorvastatin  40 mg Oral q1800  . cholecalciferol  5,000 Units Oral Daily  . fluconazole (DIFLUCAN) IV  100 mg Intravenous Q24H  . gabapentin  300 mg Oral BID  . insulin aspart  0-9 Units Subcutaneous Q4H  . insulin glargine  30 Units Subcutaneous QHS  . levothyroxine  50 mcg Oral QAC breakfast  . metoprolol tartrate  12.5 mg Oral BID  . nystatin  5 mL Mouth/Throat QID  . piperacillin-tazobactam (ZOSYN)  IV  3.375 g Intravenous Q8H  . ranolazine  500 mg Oral BID  . vancomycin  750 mg Intravenous Q12H   Continuous Infusions: . sodium chloride 75 mL/hr at 03/01/16 2345  . heparin 900 Units/hr (03/02/16 0335)   PRN Meds:.acetaminophen **OR** acetaminophen (TYLENOL) oral liquid 160 mg/5 mL **OR** acetaminophen, meclizine, metoprolol, morphine injection, senna-docusate   PHYSICAL EXAM: Vital signs: Vitals:   03/01/16 2143 03/02/16 0119 03/02/16 0400 03/02/16 0543  BP: (!) 151/56 (!) 145/63 (!) 113/42 (!) 115/41  Pulse: 90 (!) 111 71 74  Resp: 20 20 (!) 21 20  Temp: 99.1 F (37.3 C) 100 F (37.8 C) 100.2 F (37.9 C) 99.3 F (37.4 C)  TempSrc: Oral Oral Oral Oral  SpO2: 94% 96% 96% 96%  Weight: 76.5 kg (168 lb 9.6 oz)     Height: 5\' 4"  (1.626 m)      Filed Weights   03/01/16 2143  Weight: 76.5 kg (168 lb 9.6 oz)   Body mass index is 28.94 kg/m.   General appearance :Awake, alert, not in any distress. Speech SLOW and soft Eyes:, pupils equally reactive to light and accomodation,no scleral icterus.Pink conjunctiva HEENT: Atraumatic and Normocephalic Neck: supple, no JVD. No cervical lymphadenopathy. No thyromegaly Resp:Good air entry bilaterally, no added sounds  CVS: S1 S2 regular.  GI: Bowel sounds present, Non tender and not distended with no gaurding, rigidity or rebound.No organomegaly Extremities: B/L Lower Ext shows no edema, both legs are warm to touch Neurology:  speech clear,Non focal-but with gen weakness Psychiatric: Normal  judgment and insight. Alert and oriented x 3. Normal mood. Musculoskeletal:No digital cyanosis Skin:No Rash, warm and dry Wounds:N/A  I have personally reviewed following labs and imaging studies  LABORATORY DATA: CBC:  Recent Labs Lab 03/02/16 0332  WBC 8.0  NEUTROABS 5.9  HGB 11.1*  HCT 33.8*  MCV 93.4  PLT 116*    Basic Metabolic Panel:  Recent Labs Lab 03/02/16 0332  NA 138  K 3.4*  CL 102  CO2 25  GLUCOSE 245*  BUN 11  CREATININE 0.97  CALCIUM 8.1*    GFR: Estimated Creatinine Clearance: 41.7 mL/min (by C-G formula based on SCr of 0.97 mg/dL).  Liver Function Tests:  Recent Labs Lab 03/02/16 0332  AST 35  ALT 18  ALKPHOS 39  BILITOT 0.9  PROT 5.3*  ALBUMIN 2.6*   No results for input(s): LIPASE, AMYLASE in the last 168 hours. No results for input(s): AMMONIA in the last 168 hours.  Coagulation Profile: No results for input(s): INR, PROTIME in the last 168 hours.  Cardiac Enzymes:  Recent Labs Lab 03/02/16 0332  TROPONINI 1.52*    BNP (last 3 results) No results for input(s): PROBNP in the last 8760  hours.  HbA1C: No results for input(s): HGBA1C in the last 72 hours.  CBG:  Recent Labs Lab 03/01/16 2355 03/02/16 0309  GLUCAP 203* 266*    Lipid Profile:  Recent Labs  03/02/16 0332  CHOL 161  HDL 27*  LDLCALC 100*  TRIG 172*  CHOLHDL 6.0    Thyroid Function Tests: No results for input(s): TSH, T4TOTAL, FREET4, T3FREE, THYROIDAB in the last 72 hours.  Anemia Panel: No results for input(s): VITAMINB12, FOLATE, FERRITIN, TIBC, IRON, RETICCTPCT in the last 72 hours.  Urine analysis: No results found for: COLORURINE, APPEARANCEUR, LABSPEC, PHURINE, GLUCOSEU, HGBUR, BILIRUBINUR, KETONESUR, PROTEINUR, UROBILINOGEN, NITRITE, LEUKOCYTESUR  Sepsis Labs: Lactic Acid, Venous No results found for: LATICACIDVEN  MICROBIOLOGY: No results found for this or any previous visit (from the past 240 hour(s)).  RADIOLOGY  STUDIES/RESULTS: Dg Chest Port 1v Same Day  Result Date: 03/02/2016 CLINICAL DATA:  Shortness of breath. EXAM: PORTABLE CHEST 1 VIEW COMPARISON:  None. FINDINGS: The heart size and mediastinal contours are within normal limits. Atherosclerosis of thoracic aorta is noted. No pneumothorax is noted. Left lung is clear. Right infrahilar opacity is noted concerning for pneumonia. No significant pleural effusion is noted. The visualized skeletal structures are unremarkable. IMPRESSION: Right infrahilar opacity concerning for pneumonia or edema. Aortic atherosclerosis. Followup PA and lateral chest X-ray is recommended in 3-4 weeks following trial of antibiotic therapy to ensure resolution and exclude underlying malignancy. Electronically Signed   By: Lupita Raider, M.D.   On: 03/02/2016 08:09     LOS: 1 day   Jeoffrey Massed, MD  [facility] Pager:[phone]  If 7PM-7AM, please contact night-coverage [email or web address] Password [facility] 03/02/2016, 8:35 AM

## 2016-03-02 NOTE — Consult Note (Signed)
NEURO HOSPITALIST CONSULT NOTE   Requestig physician: Dr. Lucrezia StarchGhimre  Reason for Consult: Subacute stroke  History obtained from:   Chart    HPI:                                                                                                                                          Valerie Arnold is an 83 y.o. female who [facility] presented at [facility] ED on 1/28 with a several day history of generalized weakness, AMS and hypotension. Her troponin was markedly elevated. Cardiology consult recommended anticoagulation with Lovenox. Her troponin trended back down and she was started on ASA and a statin. He AMS did not improve with rx of her UTI. An MRI brain on 1/29 revealed a 3-4 mm acute infarction in the right frontoparietal junction. Plan was to continue ASA and then change to Plavix. Carotid ultrasound showed high grade left ICA origin stenosis with near occlusion. Vascular surgery felt that a waiting period of 2-3 weeks would be needed prior to CEA given acute MRI and acute CVA.   The patient has had problems with swallowing and has been choking, per daughter. She was treated with Zosyn for suspected aspiration pneumonia as well as her UTI.   Echocardiogram revealed akinesis of the basal inferior wall with overall normal LV systolic function; grade 1 diastolic dysfunction with elevated LV filling pressure; moderate LVH; calcified aortic valve with no AS by doppler; moderate, posterior directed MR; mild TR.   Past Medical History:  Diagnosis Date  . Anxiety   . Depression   . Diabetes (HCC)   . Headache 06/09/2015  . High cholesterol   . Hypertension   . Macular degeneration   . Migraine   . Syncope and collapse 06/09/2015    Past Surgical History:  Procedure Laterality Date  . ABDOMINAL HYSTERECTOMY    . APPENDECTOMY    . CAROTID ARTERY ANGIOPLASTY Right   . CATARACT EXTRACTION Bilateral   . LUMBAR LAMINECTOMY      Family History  Problem Relation  Age of Onset  . Prostate cancer Father   . Heart disease Father   . Dementia Brother   . Emphysema Brother     Social History:  reports that she has never smoked. She has never used smokeless tobacco. She reports that she does not drink alcohol or use drugs.  No Known Allergies  MEDICATIONS:  Scheduled: . aspirin  325 mg Oral Daily  . atorvastatin  40 mg Oral q1800  . cholecalciferol  5,000 Units Oral Daily  . fluconazole (DIFLUCAN) IV  100 mg Intravenous Q24H  . gabapentin  300 mg Oral BID  . insulin aspart  0-9 Units Subcutaneous Q4H  . insulin glargine  30 Units Subcutaneous QHS  . levothyroxine  50 mcg Oral QAC breakfast  . metoprolol tartrate  12.5 mg Oral BID  . nystatin  5 mL Mouth/Throat QID  . piperacillin-tazobactam (ZOSYN)  IV  3.375 g Intravenous Q8H  . ranolazine  500 mg Oral BID  . vancomycin  750 mg Intravenous Q12H     ROS:                                                                                                                                       Patient with AMS and unable to provide detailed ROS.   Blood pressure (!) 154/54, pulse 72, temperature 99.2 F (37.3 C), temperature source Oral, resp. rate 16, height 5\' 4"  (1.626 m), weight 76.5 kg (168 lb 9.6 oz), SpO2 96 %.   General Examination:                                                                                                      HEENT-  Normocephalic/atraumatic.   Lungs- No gross wheezing. Respirations unlabored.  Extremities- Warm and well perfused.   Neurological Examination Mental Status: Drowsy. Oriented to self. States that she is at home. Not oriented to city or situation.  Intact comprehension of commands. No expressive aphasia noted.  Cranial Nerves: II: Visual fields grossly intact to bedside confrontation. PERRL. III,IV, VI: ptosis not present, EOMI without  nystagmus V,VII: smile symmetric, facial temp sensation normal bilaterally VIII: hearing intact to questions and commands XI: Symmetric XII: Non-cooperative Motor: Exam limited by lack of cooperation. Strength assessed by observing spontaneous movements and with light noxious stimuli.  Right : Upper extremity   4-5/5  Left:     Upper extremity   4-5/5  Lower extremity   4-5/5   Lower extremity   4-5/5 No asymmetry noted.  Sensory: Reacts to FT in all 4 extremities.  Deep Tendon Reflexes: 0 patellae and achilles. 1+ upper extremities.  Plantars: Equivocal.  Cerebellar: Slow FNF bilaterally with questionable mild ataxia on the left.  Gait: Deferred  Lab Results: Basic Metabolic Panel:  Recent Labs Lab 03/02/16 0332  NA 138  K 3.4*  CL 102  CO2 25  GLUCOSE 245*  BUN 11  CREATININE 0.97  CALCIUM 8.1*    Liver Function Tests:  Recent Labs Lab 03/02/16 0332  AST 35  ALT 18  ALKPHOS 39  BILITOT 0.9  PROT 5.3*  ALBUMIN 2.6*   No results for input(s): LIPASE, AMYLASE in the last 168 hours. No results for input(s): AMMONIA in the last 168 hours.  CBC:  Recent Labs Lab 03/02/16 0332  WBC 8.0  NEUTROABS 5.9  HGB 11.1*  HCT 33.8*  MCV 93.4  PLT 116*    Cardiac Enzymes:  Recent Labs Lab 03/02/16 0332 03/02/16 1014 03/02/16 1627  TROPONINI 1.52* 2.46* 2.38*    Lipid Panel:  Recent Labs Lab 03/02/16 0332  CHOL 161  TRIG 172*  HDL 27*  CHOLHDL 6.0  VLDL 34  LDLCALC 161*    CBG:  Recent Labs Lab 03/01/16 2355 03/02/16 0309 03/02/16 0828 03/02/16 1135 03/02/16 1658  GLUCAP 203* 266* 138* 130* 145*    Microbiology: No results found for this or any previous visit.  Coagulation Studies: No results for input(s): LABPROT, INR in the last 72 hours.  Imaging: Dg Chest Port 1v Same Day  Result Date: 03/02/2016 CLINICAL DATA:  Shortness of breath. EXAM: PORTABLE CHEST 1 VIEW COMPARISON:  None. FINDINGS: The heart size and mediastinal contours  are within normal limits. Atherosclerosis of thoracic aorta is noted. No pneumothorax is noted. Left lung is clear. Right infrahilar opacity is noted concerning for pneumonia. No significant pleural effusion is noted. The visualized skeletal structures are unremarkable. IMPRESSION: Right infrahilar opacity concerning for pneumonia or edema. Aortic atherosclerosis. Followup PA and lateral chest X-ray is recommended in 3-4 weeks following trial of antibiotic therapy to ensure resolution and exclude underlying malignancy. Electronically Signed   By: Lupita Raider, M.D.   On: 03/02/2016 08:09    Assessment: 1.  Acute 3-4 mm ischemic infarction in the right frontoparietal junction. An infarct of this size could represent acute phase of a new microvascular ischemic white matter lesion. Cardioembolic stroke also on DDx, but TTE without mural thrombus. 2. Critical stenosis of left ICA origin on Doppler ultrasound. Vascular surgery recommends 2-3 week period of stabilization prior to CEA, given subacute stroke and MI.   Recommendations: 1. Continue ASA and atorvastatin.  2. PT/OT/Speech.  3. Out of permissive HTN time window.  4. MRA could be obtained to complete CVA work up, but likely to be low-yield.  5. Reorient frequently.  6. If AMS does not improve with management of possible aspiration PNA and UTI, consider outpatient Neurology evaluation for possible incipient dementia.   Electronically signed: Dr. Caryl Pina 03/02/2016, 7:31 PM

## 2016-03-02 NOTE — Progress Notes (Signed)
Notified by lab troponin level of 1.52. NP paged.

## 2016-03-02 NOTE — Progress Notes (Signed)
ANTICOAGULATION CONSULT NOTE  Pharmacy Consult for heparin Indication: atrial fibrillation  No Known Allergies  Patient Measurements: Height: 5\' 4"  (162.6 cm) Weight: 168 lb 9.6 oz (76.5 kg) IBW/kg (Calculated) : 54.7 Heparin Dosing Weight: 70kg  Vital Signs: Temp: 98.6 F (37 C) (02/01 1013) Temp Source: Oral (02/01 1013) BP: 124/47 (02/01 1013) Pulse Rate: 68 (02/01 1013)  Assessment: 83yo female treated w/ medical management for NSTEMI at OSH then transitioned to Px dose of LMWH. Subsequently [facility]. Heparin drip started for afib and NSTEMI. Initial heparin level is slightly above goal at 0.55. No bleeding noted.  Goal of Therapy:  Heparin level 0.3-0.5 units/ml Monitor platelets by anticoagulation protocol: Yes   Plan:  Reduce heparin gtt slightly to 850 units/hr Check an 8 hr heparin level Daily heparin level and CBC  Lysle Pearlachel Damica Gravlin, PharmD, BCPS Pager # [phone] 03/02/2016 11:42 AM

## 2016-03-02 NOTE — Progress Notes (Signed)
ANTICOAGULATION CONSULT NOTE - Initial Consult  Pharmacy Consult for heparin Indication: atrial fibrillation  No Known Allergies  Patient Measurements: Height: 5\' 4"  (162.6 cm) Weight: 168 lb 9.6 oz (76.5 kg) IBW/kg (Calculated) : 54.7 Heparin Dosing Weight: 70kg  Vital Signs: Temp: 100 F (37.8 C) (02/01 0119) Temp Source: Oral (02/01 0119) BP: 145/63 (02/01 0119) Pulse Rate: 111 (02/01 0119)   Medical History: Past Medical History:  Diagnosis Date  . Anxiety   . Depression   . Diabetes (HCC)   . Headache 06/09/2015  . High cholesterol   . Hypertension   . Macular degeneration   . Migraine   . Syncope and collapse 06/09/2015    Assessment: 83yo female treated w/ medical management for NSTEMI at OSH then transitioned to Px dose of LMWH, [facility] further eval of acute infarct in the right frontoparietal junction, now in Afib, to begin heparin.  Goal of Therapy:  Heparin level 0.3-0.5 units/ml Monitor platelets by anticoagulation protocol: Yes   Plan:  Will begin heparin gtt at 900 units/hr and monitor heparin levels and CBC.  Vernard GamblesVeronda Kapono Luhn, PharmD, BCPS  03/02/2016,3:11 AM

## 2016-03-02 NOTE — Progress Notes (Signed)
*  PRELIMINARY RESULTS* Vascular Ultrasound Carotid Duplex (Doppler) has been completed.   Findings suggest upper range 40-59% versus low range 60-79% right internal carotid artery stenosis and 80-99% left internal carotid artery stenosis. Vertebral arteries are patent with antegrade flow.  Preliminary results discussed with Dr. Randie Heinzain.  03/02/2016 3:26 PM Gertie FeyMichelle Haeley Fordham, BS, RVT, RDCS, RDMS

## 2016-03-02 NOTE — Progress Notes (Signed)
Paged by RN regarding rapid and labile HR. EKG obtained, demonstrates a fib with RVR and ST-depressions laterally without chest pain. Started on anticoagulation with heparin, will check serial troponins, treat rapid rate with Lopressor.

## 2016-03-02 NOTE — Consult Note (Signed)
Hospital Consult    Reason for Consult:  Left carotid artery stenosis  MRN #:  [id number]  History of Present Illness: This is a 83 y.o. female admitted to [facility] last Sunday with altered mental status and found to have elevated troponins. Subsequen MRI demonstrated acute cva of frontoparietal region with following carotid duplex high grade stenosis on left. All of this is per chart as no imaging currently available. She has subsequently become more alert and talkative and does not demonstrate any motor deficits. Currently she disoriented to person, place, and time but is alert and conversive. Per family she had a right CEA 15 years ago in [place] but does not have other vascular history. She is already a patient of [facility] for headache. There is no history of prior cva or heart disease. She does have history of dm, hld and htn. On exam here was in afib and now on heparin gtt with cardiology following and broad spectrum abx for pneumonia. We are consulted to manage high grade left carotid stenosis.   Past Medical History:  Diagnosis Date  . Anxiety   . Depression   . Diabetes (HCC)   . Headache 06/09/2015  . High cholesterol   . Hypertension   . Macular degeneration   . Migraine   . Syncope and collapse 06/09/2015    Past Surgical History:  Procedure Laterality Date  . ABDOMINAL HYSTERECTOMY    . APPENDECTOMY    . CAROTID ARTERY ANGIOPLASTY Right   . CATARACT EXTRACTION Bilateral   . LUMBAR LAMINECTOMY      No Known Allergies  Prior to Admission medications   Medication Sig Start Date End Date Taking? Authorizing Provider  beta carotene w/minerals (OCUVITE) tablet Take 1 tablet by mouth daily.    Historical Provider, MD  Cholecalciferol (VITAMIN D-3 PO) Take 5,000 Int'l Units by mouth daily.    Historical Provider, MD  gabapentin (NEURONTIN) 300 MG capsule Take 1 capsule (300 mg total) by mouth 2 (two) times daily. 09/09/15   York Spaniel, MD  JANUVIA 100 MG  tablet Take 100 mg by mouth daily. 03/20/15   Historical Provider, MD  LANTUS SOLOSTAR 100 UNIT/ML Solostar Pen Inject 35 Units into the skin at bedtime. 05/12/15   Historical Provider, MD  levothyroxine (SYNTHROID, LEVOTHROID) 50 MCG tablet TAKE 1 TABLET BY MOUTH DAILY ON EMPTY STOMACH WAIT 1 HOUR BEFORE AKING ANY OTHER MEDS/FOOD/DRINK 05/03/15   Historical Provider, MD  lisinopril (PRINIVIL,ZESTRIL) 5 MG tablet  07/30/15   Historical Provider, MD  meclizine (ANTIVERT) 25 MG tablet Take 25 mg by mouth 2 (two) times daily as needed for dizziness.    Historical Provider, MD  Plant Sterols and Stanols (CHOLESTOFF PO) Take by mouth daily.    Historical Provider, MD    Social History   Social History  . Marital status: Married    Spouse name: Fayrene Fearing  . Number of children: 6  . Years of education: 9   Occupational History  . Retired    Social History Main Topics  . Smoking status: Never Smoker  . Smokeless tobacco: Never Used  . Alcohol use No  . Drug use: No  . Sexual activity: Not on file   Other Topics Concern  . Not on file   Social History Narrative   Lives at home w/ her husband   Right-handed   Drinks about 2 cups of coffee per day     Family History  Problem Relation Age of Onset  . Prostate  cancer Father   . Heart disease Father   . Dementia Brother   . Emphysema Brother     ROS: [x]  Positive   [ ]  Negative   [ ]  All sytems reviewed and are negative  Cardiovascular: []  chest pain/pressure []  palpitations []  SOB lying flat []  DOE []  pain in legs while walking []  pain in legs at rest []  pain in legs at night []  non-healing ulcers []  hx of DVT []  swelling in legs  Pulmonary: []  productive cough []  asthma/wheezing []  home O2  Neurologic: []  weakness in []  arms []  legs []  numbness in []  arms []  legs [x]  hx of CVA []  mini stroke [x] difficulty speaking or slurred speech []  temporary loss of vision in one eye [x]  dizziness  Hematologic: []  hx of  cancer []  bleeding problems []  problems with blood clotting easily  Endocrine:   [x]  diabetes []  thyroid disease  GI []  vomiting blood []  blood in stool  GU: []  CKD/renal failure []  HD--[]  M/W/F or []  T/T/S []  burning with urination []  blood in urine  Psychiatric: []  anxiety []  depression  Musculoskeletal: []  arthritis []  joint pain  Integumentary: []  rashes []  ulcers  Constitutional: []  fever []  chills   Physical Examination  Vitals:   03/02/16 0543 03/02/16 1013  BP: (!) 115/41 (!) 124/47  Pulse: 74 68  Resp: 20 16  Temp: 99.3 F (37.4 C) 98.6 F (37 C)   Body mass index is 28.94 kg/m.  General:  WDWN in NAD Gait: Not observed HENT: WNL, normocephalic Pulmonary: normal non-labored breathing Cardiac: palpable radial pulses bilaterally Abdomen: soft, ntnd Extremities: moving all 4 without edema Musculoskeletal: no muscle wasting or atrophy  Neurologic: A&O X 3; Appropriate Affect ; SENSATION: normal; MOTOR FUNCTION:  moving all extremities equally.    CBC    Component Value Date/Time   WBC 8.0 03/02/2016 0332   RBC 3.62 (L) 03/02/2016 0332   HGB 11.1 (L) 03/02/2016 0332   HCT 33.8 (L) 03/02/2016 0332   PLT 116 (L) 03/02/2016 0332   MCV 93.4 03/02/2016 0332   MCH 30.7 03/02/2016 0332   MCHC 32.8 03/02/2016 0332   RDW 13.5 03/02/2016 0332   LYMPHSABS 1.9 03/02/2016 0332   MONOABS 0.2 03/02/2016 0332   EOSABS 0.0 03/02/2016 0332   BASOSABS 0.0 03/02/2016 0332    BMET    Component Value Date/Time   NA 138 03/02/2016 0332   K 3.4 (L) 03/02/2016 0332   CL 102 03/02/2016 0332   CO2 25 03/02/2016 0332   GLUCOSE 245 (H) 03/02/2016 0332   BUN 11 03/02/2016 0332   CREATININE 0.97 03/02/2016 0332   CALCIUM 8.1 (L) 03/02/2016 0332   GFRNONAA 51 (L) 03/02/2016 0332   GFRAA 60 (L) 03/02/2016 0332    COAGS: No results found for: INR, PROTIME     ASSESSMENT/PLAN: This is a 83 y.o. female admitted with AMS found to have elevated troponins  and acute right frontoparital cva with high grade left carotid stenosis.  Asa and statin from vascular standpoint Will order carotid duplex to confirm with our lab Would be beneficial to get CT angio neck and head to evaluate in case of carotid stenting in future Discussed with family, currently asymptomatic from left carotid standpoint and will manage as outpatient when she recovers from this hospitalization  Brandon C. Randie Heinzain, MD [facility] Office: [phone] Pager: [phone]

## 2016-03-02 NOTE — Progress Notes (Signed)
PT Cancellation Note  Patient Details Name: Valerie MylarMargie Amirault MRN: [id number] DOB: 06/12/1927   Cancelled Treatment:    Reason Eval/Treat Not Completed: Patient not medically ready.  Pt with elevated Troponins and new onset A-fib on EKG.  Will hold PT and mobility at this time and f/u as appropriate.     Alison MurrayMegan F Shiheem Corporan, [place] CarolinaPT [phone] 03/02/2016, 7:51 AM

## 2016-03-02 NOTE — Progress Notes (Signed)
OT Cancellation Note  Patient Details Name: Valerie MylarMargie Arnold MRN: [id number] DOB: 10/10/1927   Cancelled Treatment:    Reason Eval/Treat Not Completed: Medical issues which prohibited therapy. Troponins trending up. Will assess tomorrow if medically appropriate.   [facility] Center[facility],HILLARY  Izzabella Besse, OT/L  [phone] 03/02/2016 03/02/2016, 2:08 PM

## 2016-03-02 NOTE — Consult Note (Addendum)
Cardiology Consult    Patient ID: Valerie Arnold MRN: [id number], DOB/AGE: 1927-07-04   Admit date: 03/01/2016 Date of Consult: 03/02/2016  Primary Physician: Anselmo Pickler, MD Primary Cardiologist: New - Dr. Delton See Requesting Provider: Dr. Jerral Ralph  Patient Profile    Valerie Arnold is a 83 yo female with PMH significant for DM (insulin dependent), HTN, CKD stage 2, and HLD. She was transferred here from [facility] with presentation of ischemic stroke, Afib RVR, and NSTEMI. Cardiology was consulted for Afib RVR and NSTEMI.    Past Medical History   Past Medical History:  Diagnosis Date  . Anxiety   . Depression   . Diabetes (HCC)   . Headache 06/09/2015  . High cholesterol   . Hypertension   . Macular degeneration   . Migraine   . Syncope and collapse 06/09/2015    Past Surgical History:  Procedure Laterality Date  . ABDOMINAL HYSTERECTOMY    . APPENDECTOMY    . CAROTID ARTERY ANGIOPLASTY Right   . CATARACT EXTRACTION Bilateral   . LUMBAR LAMINECTOMY       Allergies  No Known Allergies  History of Present Illness    Valerie Arnold was [facility] to [facility] on [facility]/27/16 with gnearlized weakness, altered mental status and hypotension. Her troponins were elevated (approximately 6, per ED note) and she had a UTI.  Cardiology in [place] was consulted and she was conservatively anticoagulated with lovenox.  Treatment of UTI did not improve altered mental status and she underwent an MRI brain which showed ischemic stroke on 02/28/16.  Carotid doppler showed high-grade left carotid bifercation stenosis with near occlusion and diffuse disease on the right. Neurology was consulted and the decision was made to transfer her to [facility] for management of her stroke, carotid artery stenosis and acute MI.  On 02/29/16 patient was found to have RLL PNA vs aspiration. Family at bedside states patient has been choking on liquids. She was subsequently treated with zosyn for suspected  aspiration PNA and UTI.  She was transferred to [facility] on 03/01/16.  On 03/02/16, cardiology was consulted after pt went into Afib RVR and for management of her NSTEMI.  She was given 12.5 PO lopressor and was started on heparin gtt. She converted to NSR approximately 0400 today and has maintained normal rhythm. On my interview, she was slow to answer questions, but oriented. She c/o of chest pain at the OSH, but could not answer questions about the quality or severity of her chest pain. She currently c/o of sharp pain in her mid to left chest, without radiation.    Per patient family, underwent bilateral carotid angioplasty approximately 15 years ago. To our knowledge and per patient family, patient has no known CAD and does not have a cardiologist.   Inpatient Medications    . aspirin  325 mg Oral Daily  . atorvastatin  40 mg Oral q1800  . cholecalciferol  5,000 Units Oral Daily  . fluconazole (DIFLUCAN) IV  100 mg Intravenous Q24H  . gabapentin  300 mg Oral BID  . insulin aspart  0-9 Units Subcutaneous Q4H  . insulin glargine  30 Units Subcutaneous QHS  . levothyroxine  50 mcg Oral QAC breakfast  . metoprolol tartrate  12.5 mg Oral BID  . nystatin  5 mL Mouth/Throat QID  . piperacillin-tazobactam (ZOSYN)  IV  3.375 g Intravenous Q8H  . ranolazine  500 mg Oral BID  . vancomycin  750 mg Intravenous Q12H   . sodium  chloride 75 mL/hr at 03/01/16 2345  . heparin 850 Units/hr (03/02/16 1137)    Outpatient Medications    Prior to Admission medications   Medication Sig Start Date End Date Taking? Authorizing Provider  beta carotene w/minerals (OCUVITE) tablet Take 1 tablet by mouth daily.    Historical Provider, MD  Cholecalciferol (VITAMIN D-3 PO) Take 5,000 Int'l Units by mouth daily.    Historical Provider, MD  gabapentin (NEURONTIN) 300 MG capsule Take 1 capsule (300 mg total) by mouth 2 (two) times daily. 09/09/15   York Spaniel, MD  JANUVIA 100 MG tablet Take 100 mg by mouth  daily. 03/20/15   Historical Provider, MD  LANTUS SOLOSTAR 100 UNIT/ML Solostar Pen Inject 35 Units into the skin at bedtime. 05/12/15   Historical Provider, MD  levothyroxine (SYNTHROID, LEVOTHROID) 50 MCG tablet TAKE 1 TABLET BY MOUTH DAILY ON EMPTY STOMACH WAIT 1 HOUR BEFORE AKING ANY OTHER MEDS/FOOD/DRINK 05/03/15   Historical Provider, MD  lisinopril (PRINIVIL,ZESTRIL) 5 MG tablet  07/30/15   Historical Provider, MD  meclizine (ANTIVERT) 25 MG tablet Take 25 mg by mouth 2 (two) times daily as needed for dizziness.    Historical Provider, MD  Plant Sterols and Stanols (CHOLESTOFF PO) Take by mouth daily.    Historical Provider, MD     Family History    Family History  Problem Relation Age of Onset  . Prostate cancer Father   . Heart disease Father   . Dementia Brother   . Emphysema Brother     Social History    Social History   Social History  . Marital status: Married    Spouse name: Fayrene Fearing  . Number of children: 6  . Years of education: 9   Occupational History  . Retired    Social History Main Topics  . Smoking status: Never Smoker  . Smokeless tobacco: Never Used  . Alcohol use No  . Drug use: No  . Sexual activity: Not on file   Other Topics Concern  . Not on file   Social History Narrative   Lives at home w/ her husband   Right-handed   Drinks about 2 cups of coffee per day     Review of Systems    General:  No chills, fever, night sweats or weight changes.  Cardiovascular:  No dyspnea on exertion, edema, orthopnea, palpitations, paroxysmal nocturnal dyspnea, positive for chest pain. Dermatological: No rash, lesions/masses Respiratory: No cough, dyspnea Urologic: No hematuria, dysuria Abdominal:   No nausea, vomiting, diarrhea, bright red blood per rectum, melena, or hematemesis Neurologic:  No visual changes, changes in mental status, positive for weakness. All other systems reviewed and are otherwise negative except as noted above.  Physical Exam     Blood pressure (!) 115/41, pulse 74, temperature 99.3 F (37.4 C), temperature source Oral, resp. rate 20, height 5\' 4"  (1.626 m), weight 168 lb 9.6 oz (76.5 kg), SpO2 96 %.  General: Pleasant, NAD Psych: Normal affect. Neuro: Alert and oriented X 3, but slowed speech. Moves all extremities spontaneously. HEENT: Normal  Neck: Left carotid with bruit, mild JVD  Lungs:  Resp regular and unlabored, productive cough, coarse sound throughout bilaterally Heart: RRR no murmurs appreciated. Abdomen: Soft, non-tender, non-distended, BS + x 4.  Extremities: No clubbing, cyanosis or edema. DP/PT/Radials 1+ and equal bilaterally.  Labs    Troponin (Point of Care Test) No results for input(s): TROPIPOC in the last 72 hours.  Recent Labs  03/02/16 0332  TROPONINI 1.52*  Lab Results  Component Value Date   WBC 8.0 03/02/2016   HGB 11.1 (L) 03/02/2016   HCT 33.8 (L) 03/02/2016   MCV 93.4 03/02/2016   PLT 116 (L) 03/02/2016    Recent Labs Lab 03/02/16 0332  NA 138  K 3.4*  CL 102  CO2 25  BUN 11  CREATININE 0.97  CALCIUM 8.1*  PROT 5.3*  BILITOT 0.9  ALKPHOS 39  ALT 18  AST 35  GLUCOSE 245*   Lab Results  Component Value Date   CHOL 161 03/02/2016   HDL 27 (L) 03/02/2016   LDLCALC 100 (H) 03/02/2016   TRIG 172 (H) 03/02/2016   No results found for: [facility]   Radiology Studies    Dg Chest Port 1v Same Day  Result Date: 03/02/2016 CLINICAL DATA:  Shortness of breath. EXAM: PORTABLE CHEST 1 VIEW COMPARISON:  None. FINDINGS: The heart size and mediastinal contours are within normal limits. Atherosclerosis of thoracic aorta is noted. No pneumothorax is noted. Left lung is clear. Right infrahilar opacity is noted concerning for pneumonia. No significant pleural effusion is noted. The visualized skeletal structures are unremarkable. IMPRESSION: Right infrahilar opacity concerning for pneumonia or edema. Aortic atherosclerosis. Followup PA and lateral chest X-ray is recommended in  3-4 weeks following trial of antibiotic therapy to ensure resolution and exclude underlying malignancy. Electronically Signed   By: Lupita RaiderJames  Green Jr, M.D.   On: 03/02/2016 08:09   ECG & Cardiac Imaging    EKG 03/02/16: Afib RVR, ventricular rate 123. Possible ST changes in anterior and lateral leads  Echocardiogram 06/24/15 (for syncope, by neurologist) Study Conclusions - Left ventricle: The cavity size was normal. There was mild focal   basal hypertrophy of the septum. Systolic function was normal.   The estimated ejection fraction was in the range of 55% to 60%.   Wall motion was normal; there were no regional wall motion   abnormalities. - Aortic valve: Noncoronary cusp mobility was severely restricted.   Valve area (VTI): 2.22 cm^2. Valve area (Vmax): 2.04 cm^2. Valve   area (Vmean): 2.25 cm^2. - Mitral valve: There was mild to moderate regurgitation directed   eccentrically and posteriorly. Valve area by continuity equation   (using LVOT flow): 2.83 cm^2. - Atrial septum: No defect or patent foramen ovale was identified. - Pericardium, extracardiac: A trivial pericardial effusion was   identified.   Assessment & Plan    1. NSTEMI - troponin: 1.52 - continue to trend troponin - EKG with possible ST changes; however, difficult to interpret in the presence of Afib RVR - repeat EKG pending - echocardiogram pending - will discuss with the MD following echo results concerning further ischemic evaluation  2. Atrial fibrillation RVR - Pt given lopressor 5 mg IV x 1 at 0330, with subsequent conversion to NSR in the 60-70s - pt started on lopressor 12.5 PO BID - repeat EKG with slower rate to document - will discuss need for long-term anticoagulation, will need to discuss with neurology team - continue heparin for now  3. CVA - per primary team / neurology - no residual deficits with the exception of slow processing  4.  PNA - overnight, low grade fever without leukocytosis -  pt continues to cough and feels congested - continue ABX per primary team - consider FEES/swallow eval for aspiration following CVA  5. DM - per primary team  6. HTN - continue home meds and titrate as necessary - per primary team  Signed, Duane BostonAngela N Duke,  PA-C 03/02/2016, 9:18 AM  The patient was seen, examined and discussed with Duane Boston , PA-C and I agree with the above.    Valerie Arnold is a 83 yo female with PMH significant for DM (insulin dependent), HTN, CKD stage 2, and HLD. No prior CAD. [facility] with presentation of ischemic stroke on 02/27/2016 (acute 3-4 mm infarct in the right frontoparietal junction without hemorrhage or mass effect). Afib RVR as a new dg on admission to [facility], cardioverted spontaneously into SR with metoprolol, and NSTEMI Troponin 1.52--> 2.46 (6 in [facility]). TTE in 05/2015 showed LVEF 55-60% with no regional wall motion abnormalities, mild to moderate MR, normal left atrial size.   During evaluation of the patient's CVA, carotid Dopplers were performed, revealing high-grade left carotid bifurcation stenosis involving the proximal ICA with near occlusion, as well as mild diffuse disease on the right. Vascular surgery at [facility] was consulted regarding the high-grade carotid stenosis, but it was advised that the patient will need to wait at least 2-3 weeks for consideration of surgery given the acute MI and acute stroke.  She is on heparin for anticoagulation.   The patient is recovering from an acute stroke, we will pursue a medical management for now. A cardiac cath as an invasive procedure can cause another stroke. The patient has advanced age and is DNR. We will follow, once she recovers and experiences symptoms suspicious for ischemia we will proceed with ischemia workup.   With regards to stroke it it difficult to distinguish if it comes from severe carotid stenosis or paroxysmal a-fib, her echo showed normal  LVEF, normal left atrial size in 05/2015.  Her CHADS-VASc risk is 8, she should be fully anticoagulated but also has a high risk of fall post stroke. Neurology recommended Plavix. Its acceptable considering she was in a-fib only for short period of time.  Tobias Alexander, MD 03/02/2016

## 2016-03-02 NOTE — Care Management Note (Signed)
Case Management Note  Patient Details  Name: Valerie Arnold MRN: [id number] Date of Birth: 11/05/1927  Subjective/Objective:         Patient was [facility] for an acute CVA.  CM will follow for discharge needs pending PT/OT evals and physician orders.            Action/Plan:   Expected Discharge Date:                  Expected Discharge Plan:     In-House Referral:     Discharge planning Services     Post Acute Care Choice:    Choice offered to:     DME Arranged:    DME Agency:     HH Arranged:    HH Agency:     Status of Service:     If discussed at [employer] Length of Stay Meetings, dates discussed:    Additional Comments:  Anda KraftRobarge, Nature Kueker C, RN 03/02/2016, 10:15 AM

## 2016-03-02 NOTE — Progress Notes (Signed)
On monitor noted new onset of A. Fib. No c/o of pain. Asleep. MD made aware with order of EKG and metoprolol IV and heparin IV.

## 2016-03-02 NOTE — Progress Notes (Signed)
Pt arrived from [facility] at 2150. Alert and oriented to person. Family at bedside. Oriented to room. Bed in lowest position.

## 2016-03-03 ENCOUNTER — Inpatient Hospital Stay (HOSPITAL_COMMUNITY): Payer: Medicare Other

## 2016-03-03 DIAGNOSIS — I214 Non-ST elevation (NSTEMI) myocardial infarction: Secondary | ICD-10-CM

## 2016-03-03 LAB — HEMOGLOBIN A1C
Hgb A1c MFr Bld: 6.6 % — ABNORMAL HIGH (ref 4.8–5.6)
Mean Plasma Glucose: 143 mg/dL

## 2016-03-03 LAB — CBC
HEMATOCRIT: 32 % — AB (ref 36.0–46.0)
Hemoglobin: 10.3 g/dL — ABNORMAL LOW (ref 12.0–15.0)
MCH: 30.2 pg (ref 26.0–34.0)
MCHC: 32.2 g/dL (ref 30.0–36.0)
MCV: 93.8 fL (ref 78.0–100.0)
PLATELETS: 120 10*3/uL — AB (ref 150–400)
RBC: 3.41 MIL/uL — ABNORMAL LOW (ref 3.87–5.11)
RDW: 13.3 % (ref 11.5–15.5)
WBC: 5.6 10*3/uL (ref 4.0–10.5)

## 2016-03-03 LAB — GLUCOSE, CAPILLARY
GLUCOSE-CAPILLARY: 70 mg/dL (ref 65–99)
GLUCOSE-CAPILLARY: 140 mg/dL — AB (ref 65–99)
GLUCOSE-CAPILLARY: 110 mg/dL — AB (ref 65–99)
Glucose-Capillary: 126 mg/dL — ABNORMAL HIGH (ref 65–99)
Glucose-Capillary: 164 mg/dL — ABNORMAL HIGH (ref 65–99)
Glucose-Capillary: 79 mg/dL (ref 65–99)

## 2016-03-03 LAB — HEPARIN LEVEL (UNFRACTIONATED): Heparin Unfractionated: 0.34 IU/mL (ref 0.30–0.70)

## 2016-03-03 MED ORDER — ASPIRIN EC 81 MG PO TBEC: 81.0000 mg | DELAYED_RELEASE_TABLET | Freq: Every day | ORAL | Status: DC

## 2016-03-03 MED ORDER — IOPAMIDOL (ISOVUE-370) INJECTION 76%
INTRAVENOUS | Status: AC
  Administered 2016-03-03: 50 mL
  Filled 2016-03-03: qty 50

## 2016-03-03 MED ORDER — ASPIRIN EC 81 MG PO TBEC
81.0000 mg | DELAYED_RELEASE_TABLET | Freq: Every day | ORAL | Status: DC
  Administered 2016-03-04 – 2016-03-06 (×3): 81 mg via ORAL
  Filled 2016-03-03 (×3): qty 1

## 2016-03-03 NOTE — Progress Notes (Signed)
Inpatient Rehabilitation  Patient was screened by Fae PippinMelissa Uniqua Kihn for appropriateness for an Inpatient Acute Rehab consult.  At this time we are recommending an Inpatient Rehab consult.  MD paged to request an order.  Please order if you are agreeable.    Charlane FerrettiMelissa Kayia Billinger, M.A., CCC/SLP Admission Coordinator  [facility]  Cell [phone]

## 2016-03-03 NOTE — Progress Notes (Signed)
Occupational Therapy Evaluation Patient Details Name: Nirvana Blanchett MRN: [id number] DOB: 07/15/1927 Today's Date: 03/03/2016    History of Present Illness Pt is an 83 y.o.female who was admitted to [facility] on 1/28 for acute encephalopathy with NSTEMI. Subsequently found to have acute CVA on the right hemisphere ( right frontoparietal junction)- hospital course was complicated by aspiration PNA. Transferred to [facility] for neurology and cardiology consult. Repeat carotid doppler at [facility] showed 40-59% right ICA stenosis, with high grade left occipital carotid artery stenosis.   Clinical Impression   Pt seen as co-treat with PT. PTA, pt lived at home with husband and was modified independent @ rollator level with ADL and mobility. Pt drove. Family provided meals and cleaned. Pt presents with significant functional deficits as listed below. Feel pt is good CIR candidate, but will most likely need 24/7 S at DC. Unsure what level of support family can provide. Will follow acutely to facilitate safe DC to next venue of care.  BP 98/50 after activity. Pt symptomatic. Nsg notified.     Follow Up Recommendations  CIR;Supervision/Assistance - 24 hour    Equipment Recommendations  3 in 1 bedside commode    Recommendations for Other Services Rehab consult     Precautions / Restrictions Precautions Precautions: Fall      Mobility Bed Mobility Overal bed mobility: Needs Assistance Bed Mobility: Supine to Sit     Supine to sit: Mod assist;+2 for physical assistance        Transfers Overall transfer level: Needs assistance   Transfers: Sit to/from Stand;Stand Pivot Transfers Sit to Stand: +2 physical assistance;Mod assist Stand pivot transfers: Mod assist;+2 physical assistance            Balance Overall balance assessment: Needs assistance   Sitting balance-Leahy Scale: Poor Sitting balance - Comments: R bias     Standing balance-Leahy Scale: Poor Standing balance  comment: posteiror bias                            ADL Overall ADL's : Needs assistance/impaired Eating/Feeding: Minimal assistance;Sitting   Grooming: Moderate assistance   Upper Body Bathing: Sitting   Lower Body Bathing: Maximal assistance;Sit to/from stand   Upper Body Dressing : Moderate assistance;Sitting   Lower Body Dressing: Maximal assistance;Sit to/from stand   Toilet Transfer: Moderate assistance;+2 for physical assistance   Toileting- Clothing Manipulation and Hygiene: Maximal assistance Toileting - Clothing Manipulation Details (indicate cue type and reason): foley     Functional mobility during ADLs: +2 for physical assistance;Moderate assistance General ADL Comments: slow processing, delayed initiation, difficulty sequencing     Vision Vision Assessment?: Vision impaired- to be further tested in functional context Additional Comments: poor visual attention. Delayed saccades/pursuits   Perception Perception Comments: Appears to have visual perceptual deficits. Inattention to L side. Will further assess   Praxis Praxis Praxis tested?: Deficits Deficits: Initiation    Pertinent Vitals/Pain Pain Assessment: No/denies pain     Hand Dominance Right   Extremity/Trunk Assessment Upper Extremity Assessment Upper Extremity Assessment: RUE deficits/detail;LUE deficits/detail RUE Deficits / Details: generalized weakness LUE Deficits / Details: generalized weakness. Isolated movement patterns. question decreased sensation LUE Sensation: decreased light touch;decreased proprioception LUE Coordination: decreased fine motor;decreased gross motor   Lower Extremity Assessment Lower Extremity Assessment: Defer to PT evaluation   Cervical / Trunk Assessment Cervical / Trunk Assessment: Other exceptions Cervical / Trunk Exceptions: R bias. posterior lean   Communication Communication Communication: No  difficulties   Cognition Arousal/Alertness:  Lethargic Behavior During Therapy: Flat affect Overall Cognitive Status: Impaired/Different from baseline Area of Impairment: Attention;Memory;Following commands;Safety/judgement;Awareness;Problem solving   Current Attention Level: Sustained Memory: Decreased short-term memory Following Commands: Follows one step commands with increased time Safety/Judgement: Decreased awareness of safety;Decreased awareness of deficits Awareness: Intellectual Problem Solving: Slow processing;Decreased initiation;Difficulty sequencing;Requires verbal cues     General Comments       Exercises       Shoulder Instructions      Home Living Family/patient expects to be discharged to:: Private residence Living Arrangements: Spouse/significant other;Children Available Help at Discharge: Available 24 hours/day Type of Home: House Home Access: Ramped entrance     Home Layout: One level     Bathroom Shower/Tub: Tub/shower unit Shower/tub characteristics: Door Bathroom Toilet: Handicapped height Bathroom Accessibility: Yes How Accessible: Accessible via walker Home Equipment: Walker - 4 wheels;Shower seat;Cane - single point          Prior Functioning/Environment Level of Independence: Needs assistance  Gait / Transfers Assistance Needed: used rollator independently ADL's / Homemaking Assistance Needed: pt independent with ADL.Family assisted with meals and cleaning. pt drove Communication / Swallowing Assistance Needed: dysphagia.          OT Problem List: Decreased strength;Decreased range of motion;Decreased activity tolerance;Impaired balance (sitting and/or standing);Impaired vision/perception;Decreased coordination;Decreased cognition;Decreased safety awareness;Decreased knowledge of use of DME or AE;Cardiopulmonary status limiting activity;Impaired sensation;Impaired tone;Impaired UE functional use;Pain   OT Treatment/Interventions: Self-care/ADL training;Therapeutic  exercise;Neuromuscular education;Energy conservation;DME and/or AE instruction;Therapeutic activities;Cognitive remediation/compensation;Visual/perceptual remediation/compensation;Patient/family education;Balance training    OT Goals(Current goals can be found in the care plan section) Acute Rehab OT Goals Patient Stated Goal: per family - to get better OT Goal Formulation: With patient Time For Goal Achievement: 03/17/16 Potential to Achieve Goals: Good ADL Goals Pt Will Perform Eating: with set-up;sitting Pt Will Perform Grooming: with set-up;with supervision;sitting Pt Will Perform Upper Body Bathing: with set-up;with supervision;sitting Pt Will Perform Lower Body Bathing: with min assist;sit to/from stand Pt Will Transfer to Toilet: with min assist;ambulating;bedside commode  OT Frequency: Min 2X/week   Barriers to D/C:            Co-evaluation PT/OT/SLP Co-Evaluation/Treatment: Yes Reason for Co-Treatment: Complexity of the patient's impairments (multi-system involvement);For patient/therapist safety;To address functional/ADL transfers   OT goals addressed during session: ADL's and self-care      End of Session Equipment Utilized During Treatment: Gait belt Nurse Communication: Mobility status;Other (comment) (low BP)  Activity Tolerance: Patient tolerated treatment well Patient left: in chair;with call bell/phone within reach;with chair alarm set;with family/visitor present   Time: 1132-1205 OT Time Calculation (min): 33 min Charges:  OT General Charges $OT Visit: 1 Procedure OT Evaluation $OT Eval Moderate Complexity: 1 Procedure G-Codes:    Zana Biancardi,HILLARY 03/03/2016, 1:38 PM   [facility] Ashiya Kinkead, OT/L  [phone] 03/03/2016

## 2016-03-03 NOTE — Progress Notes (Signed)
Progress Note  Patient Name: Valerie Arnold Date of Encounter: 03/03/2016  Primary Cardiologist: New to Dr. Delton See  Subjective   Feeling better. No chest pain or SOB.   Inpatient Medications    Scheduled Meds: . aspirin  325 mg Oral Daily  . atorvastatin  40 mg Oral q1800  . cholecalciferol  5,000 Units Oral Daily  . fluconazole (DIFLUCAN) IV  100 mg Intravenous Q24H  . gabapentin  300 mg Oral BID  . insulin aspart  0-9 Units Subcutaneous Q4H  . insulin glargine  30 Units Subcutaneous QHS  . levothyroxine  50 mcg Oral QAC breakfast  . metoprolol tartrate  12.5 mg Oral BID  . nystatin  5 mL Mouth/Throat QID  . piperacillin-tazobactam (ZOSYN)  IV  3.375 g Intravenous Q8H  . ranolazine  500 mg Oral BID  . vancomycin  750 mg Intravenous Q12H   Continuous Infusions: . sodium chloride 75 mL/hr at 03/01/16 2345  . heparin 850 Units/hr (03/03/16 0906)   PRN Meds: acetaminophen **OR** acetaminophen (TYLENOL) oral liquid 160 mg/5 mL **OR** acetaminophen, meclizine, metoprolol, morphine injection, senna-docusate   Vital Signs    Vitals:   03/02/16 1410 03/02/16 2100 03/03/16 0151 03/03/16 0500  BP: (!) 154/54 (!) 159/59 (!) 119/93 (!) 139/51  Pulse: 72 78 66 68  Resp: 16 16 18 18   Temp: 99.2 F (37.3 C) 98.9 F (37.2 C) 98.6 F (37 C) 99.1 F (37.3 C)  TempSrc: Oral Oral Oral Axillary  SpO2: 96% 95% 93% 93%  Weight:      Height:        Intake/Output Summary (Last 24 hours) at 03/03/16 1038 Last data filed at 03/03/16 0048  Gross per 24 hour  Intake              650 ml  Output             1050 ml  Net             -400 ml   Filed Weights   03/01/16 2143  Weight: 168 lb 9.6 oz (76.5 kg)    Telemetry    NSR at rate of 60s- Personally Reviewed  ECG    N/A  Physical Exam   GEN: No acute distress.   Neck: No JVD. L carotid bruit Cardiac: RRR, no murmurs, rubs, or gallops.  Respiratory: Clear to auscultation bilaterally. GI: Soft, nontender, non-distended    MS: No edema; No deformity. Neuro:  Nonfocal  Psych: Normal affect   Labs    Chemistry Recent Labs Lab 03/02/16 0332  NA 138  K 3.4*  CL 102  CO2 25  GLUCOSE 245*  BUN 11  CREATININE 0.97  CALCIUM 8.1*  PROT 5.3*  ALBUMIN 2.6*  AST 35  ALT 18  ALKPHOS 39  BILITOT 0.9  GFRNONAA 51*  GFRAA 60*  ANIONGAP 11     Hematology Recent Labs Lab 03/02/16 0332 03/03/16 0327  WBC 8.0 5.6  RBC 3.62* 3.41*  HGB 11.1* 10.3*  HCT 33.8* 32.0*  MCV 93.4 93.8  MCH 30.7 30.2  MCHC 32.8 32.2  RDW 13.5 13.3  PLT 116* 120*    Cardiac Enzymes Recent Labs Lab 03/02/16 0332 03/02/16 1014 03/02/16 1627  TROPONINI 1.52* 2.46* 2.38*   No results for input(s): TROPIPOC in the last 168 hours.   BNPNo results for input(s): BNP, PROBNP in the last 168 hours.   DDimer No results for input(s): DDIMER in the last 168 hours.   Radiology  Dg Chest Port 1v Same Day  Result Date: 03/02/2016 CLINICAL DATA:  Shortness of breath. EXAM: PORTABLE CHEST 1 VIEW COMPARISON:  None. FINDINGS: The heart size and mediastinal contours are within normal limits. Atherosclerosis of thoracic aorta is noted. No pneumothorax is noted. Left lung is clear. Right infrahilar opacity is noted concerning for pneumonia. No significant pleural effusion is noted. The visualized skeletal structures are unremarkable. IMPRESSION: Right infrahilar opacity concerning for pneumonia or edema. Aortic atherosclerosis. Followup PA and lateral chest X-ray is recommended in 3-4 weeks following trial of antibiotic therapy to ensure resolution and exclude underlying malignancy. Electronically Signed   By: Lupita RaiderJames  Green Jr, M.D.   On: 03/02/2016 08:09    Cardiac Studies   Echo 03/02/16 LV EF: 55% -   60%  ------------------------------------------------------------------- Indications:      Chest pain 786.51.  ------------------------------------------------------------------- History:   PMH:   Syncope.  Risk factors:   Hypertension. Diabetes mellitus.  ------------------------------------------------------------------- Study Conclusions  - Left ventricle: The cavity size was normal. Wall thickness was   increased in a pattern of moderate LVH. Systolic function was   normal. The estimated ejection fraction was in the range of 55%   to 60%. There is akinesis of the basalinferior myocardium.   Doppler parameters are consistent with abnormal left ventricular   relaxation (grade 1 diastolic dysfunction). Doppler parameters   are consistent with high ventricular filling pressure. - Mitral valve: There was moderate regurgitation. - Pulmonary arteries: PA peak pressure: 34 mm Hg (S). - Pericardium, extracardiac: A trivial pericardial effusion was   identified.  Impressions:  - Akinesis of the basal inferior wall with overall normal LV   systolic function; grade 1 diastolic dysfunction with elevated LV   filling pressure; moderate LVH; calcified aortic valve with no AS   by doppler; moderate, posterior directed MR; mild TR.  *PRELIMINARY RESULTS* Vascular Ultrasound Carotid Duplex (Doppler) has been completed.   Findings suggest upper range 40-59% versus low range 60-79% right internal carotid artery stenosis and 80-99% left internal carotid artery stenosis. Vertebral arteries are patent with antegrade flow.  Preliminary results discussed with Dr. Randie Heinzain.  Patient Profile     Ms Ellis SavageKiser is a 83 yo female with PMH significant for DM (insulin dependent), HTN, CKD stage 2, and HLD. No prior CAD. [facility] with presentation of ischemic stroke on 02/27/2016 (acute 3-4 mm infarct in the right frontoparietal junction without hemorrhage or mass effect). Afib RVR as a new dg [facility], cardioverted spontaneously into SR with metoprolol, and NSTEMI Troponin 1.52--> 2.46 ([facility]).   Assessment & Plan    1. Afib with RVR - [facility].  Maintaining sinus rhythm. Echo yesterday showed normal LV function. Akinesis of the basal inferior wall with overall normal LV systolic function; grade 1 diastolic dysfunction with elevated LV filling pressure; moderate LVH; calcified aortic valve with no AS by doppler; moderate, posterior directed MR; mild TR. - CHADSVASc score of 8. Currently on heparin on anticoagulation. High fall risk post stroke. Pending PT/OT eval.   2. NSTEMI - Troponin 1.52--> 2.46 -->2.38 ([facility] Chest pain free. Medical management given recent stroke. No chest pain. Currently on ASA 325mg  and IV heparin.   3. Carotid artery stenosis - followed by vascular surgery. Surgery in 2-3 weeks.  4. CVA - improving.   Signed, Manson PasseyBhagat,Bhavinkumar, PA  03/03/2016, 10:38 AM    The patient was seen, examined and discussed  with Bhagat,Bhavinkumar PA-C and I agree with the above.   Ms Riding is a 83 yo female with PMH significant for DM (insulin dependent), HTN, CKD stage 2, and HLD. No prior CAD. She was transferred here from [facility] with presentation of ischemic stroke on 02/27/2016 (acute 3-4 mm infarct in the right frontoparietal junction without hemorrhage or mass effect). Afib RVR as a new dg on admission to [facility], cardioverted spontaneously into SR with metoprolol, and NSTEMI Troponin 1.52--> 2.46 (6 in [facility]). TTE in 05/2015 showed LVEF 55-60% with no regional wall motion abnormalities, mild to moderate MR, normal left atrial size.   During evaluation of the patient's CVA, carotid Dopplers were performed, revealing high-grade left carotid bifurcation stenosis involving the proximal ICA with near occlusion, as well as mild diffuse disease on the right. Vascular surgery at [facility] consulted regarding the high-grade carotid stenosis, but it was advised that the patient will need to wait at least 2-3 weeks for consideration of surgery given the acute MI and acute stroke.  She is on heparin  for anticoagulation.   The patient is recovering from an acute stroke, we will pursue a medical management for now. A cardiac cath as an invasive procedure can cause another stroke. The patient has advanced age and is DNR. We will follow, once she recovers and experiences symptoms suspicious for ischemia we will proceed with ischemia workup.   With regards to stroke it it difficult to distinguish if it comes from severe carotid stenosis or paroxysmal a-fib, her echo showed normal LVEF, normal left atrial size in 05/2015.  Her CHADS-VASc risk is 8, she should be fully anticoagulated but also has a high risk of fall post stroke. Neurology recommended Plavix. Its acceptable considering she was in a-fib only for short period of time.  We will follow as outpatient and consider a cath if she recovers and is symptomatic.   Tobias Alexander, MD 03/03/2016

## 2016-03-03 NOTE — Progress Notes (Signed)
STROKE TEAM PROGRESS NOTE   HISTORY OF PRESENT ILLNESS (per record) Valerie Arnold is an 83 y.o. female who [facility] with a several day history of generalized weakness, AMS and hypotension. Her troponin was markedly elevated. Cardiology consult recommended anticoagulation with Lovenox. Her troponin trended back down and she was started on ASA and a statin. He AMS did not improve with rx of her UTI. An MRI brain on 1/29 revealed a 3-4 mm acute infarction in the right frontoparietal junction. Plan was to continue ASA and then change to Plavix. Carotid ultrasound showed high grade left ICA origin stenosis with near occlusion. Vascular surgery felt that a waiting period of 2-3 weeks would be needed prior to CEA given acute MRI and acute CVA.   The patient has had problems with swallowing and has been choking, per daughter. She was treated with Zosyn for suspected aspiration pneumonia as well as her UTI.   Echocardiogram revealed akinesis of the basal inferior wall with overall normal LV systolic function; grade 1 diastolic dysfunction with elevated LV filling pressure; moderate LVH; calcified aortic valve with no AS by doppler; moderate, posterior directed MR; mild TR.  Patient was not administered IV t-PA secondary to . She was admitted for further evaluation and treatment.   SUBJECTIVE (INTERVAL HISTORY) Come to see patient once in the morning and once in the afternoon, patient both times were at tests and not in room.   OBJECTIVE Temp:  [98.6 F (37 C)-99.2 F (37.3 C)] 98.9 F (37.2 C) (02/02 1045) Pulse Rate:  [66-78] 66 (02/02 1045) Cardiac Rhythm: Normal sinus rhythm;Heart block;Bundle branch block (02/02 0700) Resp:  [16-18] 18 (02/02 1045) BP: (119-159)/(51-93) 133/58 (02/02 1045) SpO2:  [93 %-96 %] 96 % (02/02 1045)  CBC:  Recent Labs Lab 03/02/16 0332 03/03/16 0327  WBC 8.0 5.6  NEUTROABS 5.9  --   HGB 11.1* 10.3*  HCT 33.8*  32.0*  MCV 93.4 93.8  PLT 116* 120*    Basic Metabolic Panel:  Recent Labs Lab 03/02/16 0332  NA 138  K 3.4*  CL 102  CO2 25  GLUCOSE 245*  BUN 11  CREATININE 0.97  CALCIUM 8.1*    Lipid Panel:    Component Value Date/Time   CHOL 161 03/02/2016 0332   TRIG 172 (H) 03/02/2016 0332   HDL 27 (L) 03/02/2016 0332   CHOLHDL 6.0 03/02/2016 0332   VLDL 34 03/02/2016 0332   LDLCALC 100 (H) 03/02/2016 0332   HgbA1c:  Lab Results  Component Value Date   HGBA1C 6.6 (H) 03/02/2016   Urine Drug Screen: No results found for: LABOPIA, COCAINSCRNUR, LABBENZ, AMPHETMU, THCU, LABBARB    IMAGING I have personally reviewed the radiological images below and agree with the radiology interpretations.  CT angiogram head and neck Pending   2-D echocardiogram - Left ventricle: The cavity size was normal. Wall thickness was increased in a pattern of moderate LVH. Systolic function was normal. The estimated ejection fraction was in the range of 55% to 60%. There is akinesis of the basalinferior myocardium. Doppler parameters are consistent with abnormal left ventricular relaxation (grade 1 diastolic dysfunction). Doppler parameters are consistent with high ventricular filling pressure. - Mitral valve: There was moderate regurgitation. - Pulmonary arteries: PA peak pressure: 34 mm Hg (S). - Pericardium, extracardiac: A trivial pericardial effusion was identified. Impressions: - Akinesis of the basal inferior wall with overall normal LV  systolic function; grade 1 diastolic dysfunction with elevated LV  filling pressure; moderate LVH; calcified aortic valve with no AS by doppler; moderate, posterior directed MR; mild TR.  Carotid Doppler upper range 40-59% versus low range 60-79% right internal carotid artery stenosis and 80-99% left internal carotid artery stenosis. Vertebral arteries are patent with antegrade flow.  PHYSICAL EXAM Tried to see patient once in the morning and once in the  afternoon, patient both times were at tests and not in room.  ASSESSMENT/PLAN Ms. Valerie Arnold is a 83 y.o. female with history of diabetes, hypertension, chronic kidney disease stage II and hyperlipidemia with no prior coronary artery disease. He was transferred from [place] with ischemic stroke. New A. fib on admission to [facility] and NSTEMI at [facility].   Stroke:  right frontoparietal subcortical punctate infarct embolic likely secondary to new diagnosis atrial fibrillation   CTA head & neck pending  Carotid Doppler  R ICA 40-59% to 60-79% stenosis, L ICA 80-99% stenosis  2D Echo  EF 55-60%. No obvious source of embolus  LDL 100  HgbA1c 6.6  IV heparin for VTE prophylaxis  DIET DYS 2 Room service appropriate? Yes; Fluid consistency: Thin  No antithrombotic prior to admission, now on aspirin 81 mg daily and heparin IV.  Ongoing aggressive stroke risk factor management  Therapy recommendations:  CIR. Consult requested.  Disposition:  pending   Patient followed with Dr. Anne Hahn in [facility] in the past  Atrial Fibrillation w/ RVR  New diagnosis this admission  Spontaneously converted to sinus rhythm with metoprolol   CHA2DS2-VASc Score = 8  Anticoagulation recommended, prefer eliquis for stroke prevention. However, will up to vessel surgery for ideal regimen before scheduled CEA.  Carotid stenosis   Carotid Doppler  R ICA 40-59% to 60-79% stenosis, L ICA 80-99%  Left ICA stenosis likely to be asymptomatic  CTA head and neck pending to evaluate right ICA stenosis  Followed by the [facility]  Surgery planned in 2-3 weeks  Continue aspirin  NSTEMI  Elevated troponin at [facility] prior to transfer here  Cardiology on board  On heparin IV and aspirin  Hypertension  Stable  Permissive hypertension (OK if < 180/105) but gradually normalize in 5-7 days  Long-term BP goal normotensive  Hyperlipidemia  Home meds:  No statin  LDL 100, goal <  70  Now on Lipitor 40 mg daily  Continue statin at discharge  Diabetes type II  HgbA1c 6.6, goal < 7.0  Controlled  On Lantus  SSI  Other Stroke Risk Factors  Advanced age  Migraines  Other Active Problems  Chronic kidney disease stage II  Probable aspiration pneumonia  Dysphagia secondary to stroke  Oral candidiasis  Hypothyroidism  Generalized weakness/physical deconditioning  Hospital day # 2  Marvel Plan, MD PhD Stroke Neurology 03/03/2016 7:00 PM   To contact Stroke Continuity provider, please refer to [email or web address]. After hours, contact General Neurology

## 2016-03-03 NOTE — Progress Notes (Addendum)
Modified Barium Swallow Progress Note  Patient Details  Name: Valerie MylarMargie Arnold MRN: [id number] Date of Birth: 02/21/1927  Today's Date: 03/03/2016  Modified Barium Swallow completed.  Full report located under Chart Review in the Imaging Section.  Brief recommendations include the following:  Clinical Impression  Pt has a mild oral dysphagia but with no aspiration or penetration observed. View of oral phase was limited by dental hardware, but from what could be seen, there was oral residue left behind by pureed and soft solids.  Pt did a spontaneous second swallow to reduce residue. She had difficulty propeling a pill posterior with thin liquids, and needed multiple liquid washes in an attempt to clear. Of note, there was no coughing observed during MBS as there has been at bedside. Recommend that pt remain on current Dys 2 diet and thin liquids. SLP to f/u for solid advancement as appropriate.   Swallow Evaluation Recommendations       SLP Diet Recommendations: Dysphagia 2 (Fine chop) solids;Thin liquid   Liquid Administration via: Cup;Straw   Medication Administration: Whole meds with puree   Supervision: Full supervision/cueing for compensatory strategies;Staff to assist with self feeding   Compensations: Slow rate;Minimize environmental distractions;Small sips/bites;Follow solids with liquid   Postural Changes: Seated upright at 90 degrees   Oral Care Recommendations: Oral care BID        Maxcine Hamaiewonsky, Teyla Skidgel 03/03/2016,2:24 PM   Maxcine HamLaura Paiewonsky, M.A. CCC-SLP [phone]

## 2016-03-03 NOTE — Progress Notes (Signed)
  Progress Note    03/03/2016 8:38 AM * No surgery found *  Subjective:  No acute overnight issues  Vitals:   03/03/16 0151 03/03/16 0500  BP: (!) 119/93 (!) 139/51  Pulse: 66 68  Resp: 18 18  Temp: 98.6 F (37 C) 99.1 F (37.3 C)    Physical Exam: Disoriented Moving all 4  Palpable femoral pulses bilaterally  CBC    Component Value Date/Time   WBC 5.6 03/03/2016 0327   RBC 3.41 (L) 03/03/2016 0327   HGB 10.3 (L) 03/03/2016 0327   HCT 32.0 (L) 03/03/2016 0327   PLT 120 (L) 03/03/2016 0327   MCV 93.8 03/03/2016 0327   MCH 30.2 03/03/2016 0327   MCHC 32.2 03/03/2016 0327   RDW 13.3 03/03/2016 0327   LYMPHSABS 1.9 03/02/2016 0332   MONOABS 0.2 03/02/2016 0332   EOSABS 0.0 03/02/2016 0332   BASOSABS 0.0 03/02/2016 0332    BMET    Component Value Date/Time   NA 138 03/02/2016 0332   K 3.4 (L) 03/02/2016 0332   CL 102 03/02/2016 0332   CO2 25 03/02/2016 0332   GLUCOSE 245 (H) 03/02/2016 0332   BUN 11 03/02/2016 0332   CREATININE 0.97 03/02/2016 0332   CALCIUM 8.1 (L) 03/02/2016 0332   GFRNONAA 51 (L) 03/02/2016 0332   GFRAA 60 (L) 03/02/2016 0332    INR No results found for: INR   Intake/Output Summary (Last 24 hours) at 03/03/16 0838 Last data filed at 03/03/16 0048  Gross per 24 hour  Intake              650 ml  Output             1050 ml  Net             -400 ml     Assessment:  83 y.o. female is here with right frontoparietal cva by outside mri  Plan: CT angio neck and head to evaluate bilateral carotid arteries. I discussed with present family that possible lesion on right could have contributed to cva. Either way, she needs to recover more toward baseline prior to consideration of any intervention. Left carotid stenosis would be handled as outpatient. Continue asa and statin.   Brandon C. Randie Heinzain, MD [facility] Office: [phone] Pager: [phone]  03/03/2016 8:38 AM

## 2016-03-03 NOTE — Progress Notes (Signed)
ANTICOAGULATION CONSULT NOTE - Follow Up Consult  Pharmacy Consult for Heparin Indication: atrial fibrillation  No Known Allergies  Patient Measurements: Height: 5\' 4"  (162.6 cm) Weight: 168 lb 9.6 oz (76.5 kg) IBW/kg (Calculated) : 54.7 Heparin Dosing Weight: 70kg  Vital Signs: Temp: 99.1 F (37.3 C) (02/02 0500) Temp Source: Axillary (02/02 0500) BP: 139/51 (02/02 0500) Pulse Rate: 68 (02/02 0500)  Labs:  Recent Labs  03/02/16 0332 03/02/16 1014 03/02/16 1627 03/02/16 2012 03/03/16 0327  HGB 11.1*  --   --   --  10.3*  HCT 33.8*  --   --   --  32.0*  PLT 116*  --   --   --  120*  HEPARINUNFRC  --  0.55  --  0.40 0.34  CREATININE 0.97  --   --   --   --   TROPONINI 1.52* 2.46* 2.38*  --   --     Estimated Creatinine Clearance: 41.7 mL/min (by C-G formula based on SCr of 0.97 mg/dL).   Medications:  Heparin @ 850 units/hr  Assessment: 83yo female treated w/ medical management for NSTEMI at OSH then transitioned to prophylactic dose of LMWH. Subsequently [facility], found to be in afib and heparin started. Heparin level is at goal 0.34. CBC stable. No bleeding.  Goal of Therapy:  Heparin level 0.3-0.5 units/ml Monitor platelets by anticoagulation protocol: Yes   Plan:  1) Continue heparin at 850 units/hr 2) Daily heparin level and CBC  Fredrik RiggerMarkle, Cathryn Gallery Sue 03/03/2016,8:25 AM

## 2016-03-03 NOTE — Consult Note (Signed)
Physical Medicine and Rehabilitation Consult Reason for Consult: Right frontoparietal infarct Referring Physician: [facility]   HPI: Valerie Arnold is a 83 y.o. right handed female with history of insulin-dependent diabetes mellitus, hypertension. Per chart review patient lives with spouse. Use a rollerblading walker prior to admission. She was independent with ADLs. One level home with ramped entrance. She does have children in the area that check on her. [facility]ed [facility] department 02/27/2016 for evaluation of generalized weakness, altered mental status and hypotension. She was found to have elevated troponin in the 6 range as well as probable UTI. Cardiology was consulted placed on Lovenox with conservative approach. Troponin trended back down and patient was transitioned to aspirin as well as a beta blocker. Her mental status did not improve she had been placed on empiric treatment of UTI. MRI completed showing a 3-4 mm infarct in the right frontoparietal junction without hemorrhage or mass effect. Neurology was consulted advised change aspirin to Plavix. Carotid Dopplers revealed high-grade left carotid bifurcation stenosis involving the proximal ICA with near occlusion. She was transferred to [facility] for further evaluation. Echocardiogram showed ejection fraction 60% grade 1 diastolic dysfunction. Vascular surgery consulted in regards to ICA stenosis with CT angiogram head and neck pending and felt could be addressed as an outpatient regarding further intervention.. Patient bouts of atrial fibrillation with RVR. Cardiology follow-up suspect NSTEMI per EKG and elevated troponins. Currently maintained on aspirin and heparin therapy. Currently maintained on a dysphagia #2 thin liquid diet. Occupational therapy evaluation completed 03/03/2016 with recommendations of physical medicine rehabilitation consult. Formal physical therapy evaluation is pending.   Review of  Systems  Constitutional: Negative for chills and fever.  HENT: Negative for hearing loss.   Eyes: Negative for blurred vision and double vision.  Respiratory: Positive for cough and shortness of breath.   Cardiovascular: Positive for palpitations and leg swelling. Negative for chest pain.  Gastrointestinal: Positive for constipation. Negative for nausea and vomiting.  Genitourinary: Positive for urgency. Negative for hematuria.  Musculoskeletal: Positive for joint pain and myalgias.  Skin: Negative for rash.  Neurological: Positive for dizziness, weakness and headaches. Negative for seizures and loss of consciousness.  Psychiatric/Behavioral: Positive for depression.       Anxiety  All other systems reviewed and are negative.  Past Medical History:  Diagnosis Date  . Anxiety   . Depression   . Diabetes (HCC)   . Headache 06/09/2015  . High cholesterol   . Hypertension   . Macular degeneration   . Migraine   . Syncope and collapse 06/09/2015   Past Surgical History:  Procedure Laterality Date  . ABDOMINAL HYSTERECTOMY    . APPENDECTOMY    . CAROTID ARTERY ANGIOPLASTY Right   . CATARACT EXTRACTION Bilateral   . LUMBAR LAMINECTOMY     Family History  Problem Relation Age of Onset  . Prostate cancer Father   . Heart disease Father   . Dementia Brother   . Emphysema Brother    Social History:  reports that she has never smoked. She has never used smokeless tobacco. She reports that she does not drink alcohol or use drugs. Allergies: No Known Allergies Medications Prior to Admission  Medication Sig Dispense Refill  . beta carotene w/minerals (OCUVITE) tablet Take 1 tablet by mouth daily.    . Cholecalciferol (VITAMIN D-3 PO) Take 5,000 Int'l Units by mouth daily.    Marland Kitchen. gabapentin (NEURONTIN) 300 MG capsule Take 1 capsule (300 mg  total) by mouth 2 (two) times daily. 60 capsule 3  . JANUVIA 100 MG tablet Take 100 mg by mouth daily.  5  . LANTUS SOLOSTAR 100 UNIT/ML Solostar Pen  Inject 35 Units into the skin at bedtime.  5  . levothyroxine (SYNTHROID, LEVOTHROID) 50 MCG tablet TAKE 1 TABLET BY MOUTH DAILY ON EMPTY STOMACH WAIT 1 HOUR BEFORE AKING ANY OTHER MEDS/FOOD/DRINK  5  . lisinopril (PRINIVIL,ZESTRIL) 5 MG tablet     . meclizine (ANTIVERT) 25 MG tablet Take 25 mg by mouth 2 (two) times daily as needed for dizziness.    . Plant Sterols and Stanols (CHOLESTOFF PO) Take by mouth daily.      Home: Home Living Family/patient expects to be discharged to:: (P) Private residence Living Arrangements: (P) Spouse/significant other, Children Available Help at Discharge: (P) Available 24 hours/day Type of Home: (P) House Home Access: (P) Ramped entrance Home Layout: (P) One level Bathroom Shower/Tub: (P) Tub/shower unit Bathroom Toilet: (P) Handicapped height Bathroom Accessibility: (P) Yes Home Equipment: (P) Walker - 4 wheels, Shower seat, Medical laboratory scientific officer - single point  Lives With: Spouse  Functional History: Prior Function Level of Independence: (P) Needs assistance Gait / Transfers Assistance Needed: (P) used rollator independently ADL's / Homemaking Assistance Needed: (P) pt independent with ADL.Family assisted with meals and cleaning. pt drove Communication / Swallowing Assistance Needed: (P) dysphagia. Functional Status:  Mobility: Bed Mobility Overal bed mobility: (P) Needs Assistance Bed Mobility: (P) Supine to Sit Supine to sit: (P) Mod assist, +2 for physical assistance Transfers Overall transfer level: (P) Needs assistance Transfers: (P) Sit to/from Stand, Stand Pivot Transfers Sit to Stand: (P) +2 physical assistance, Mod assist Stand pivot transfers: (P) Mod assist, +2 physical assistance      ADL: ADL Overall ADL's : Needs assistance/impaired Eating/Feeding: Minimal assistance, Sitting Grooming: Moderate assistance Upper Body Bathing: Sitting Lower Body Bathing: Maximal assistance, Sit to/from stand Upper Body Dressing : Moderate assistance,  Sitting Lower Body Dressing: Maximal assistance, Sit to/from stand Toilet Transfer: Moderate assistance, +2 for physical assistance Toileting- Clothing Manipulation and Hygiene: Maximal assistance Toileting - Clothing Manipulation Details (indicate cue type and reason): foley Functional mobility during ADLs: +2 for physical assistance, Moderate assistance General ADL Comments: slow processing, delayed initiation, difficulty sequencing  Cognition: Cognition Overall Cognitive Status: (P) Impaired/Different from baseline Arousal/Alertness: Awake/alert Orientation Level: Oriented to person, Disoriented to place, Disoriented to time Attention: Sustained Sustained Attention: Impaired Sustained Attention Impairment: Verbal basic Awareness: Impaired Awareness Impairment: Intellectual impairment, Emergent impairment Problem Solving: Impaired Problem Solving Impairment: Verbal basic, Functional basic Safety/Judgment: Impaired Cognition Arousal/Alertness: (P) Lethargic Behavior During Therapy: (P) Flat affect Overall Cognitive Status: (P) Impaired/Different from baseline Area of Impairment: (P) Attention, Memory, Following commands, Safety/judgement, Awareness, Problem solving Current Attention Level: (P) Sustained Memory: (P) Decreased short-term memory Following Commands: (P) Follows one step commands with increased time Safety/Judgement: (P) Decreased awareness of safety, Decreased awareness of deficits Awareness: (P) Intellectual Problem Solving: (P) Slow processing, Decreased initiation, Difficulty sequencing, Requires verbal cues  Blood pressure (!) 133/58, pulse 66, temperature 98.9 F (37.2 C), temperature source Oral, resp. rate 18, height 5\' 4"  (1.626 m), weight 76.5 kg (168 lb 9.6 oz), SpO2 96 %. Physical Exam  HENT:  Head: Normocephalic.  Eyes: EOM are normal.  Neck: Normal range of motion. Neck supple. No thyromegaly present.  Cardiovascular:  Cardiac rate controlled    Respiratory: Effort normal and breath sounds normal. No respiratory distress.  GI: Soft. Bowel sounds are normal. She exhibits no  distension.  Neurological: She is alert.  She was able to  provide her name, age and date of birth. Followed simple commands  Skin: Skin is warm and dry.    Results for orders placed or performed during the hospital encounter of 03/01/16 (from the past 24 hour(s))  Troponin I (q 6hr x 3)     Status: Abnormal   Collection Time: 03/02/16  4:27 PM  Result Value Ref Range   Troponin I 2.38 (HH) <0.03 ng/mL  Glucose, capillary     Status: Abnormal   Collection Time: 03/02/16  4:58 PM  Result Value Ref Range   Glucose-Capillary 145 (H) 65 - 99 mg/dL  Glucose, capillary     Status: Abnormal   Collection Time: 03/02/16  8:08 PM  Result Value Ref Range   Glucose-Capillary 205 (H) 65 - 99 mg/dL   Comment 1 Notify RN    Comment 2 Document in Chart   Heparin level (unfractionated)     Status: None   Collection Time: 03/02/16  8:12 PM  Result Value Ref Range   Heparin Unfractionated 0.40 0.30 - 0.70 IU/mL  Glucose, capillary     Status: Abnormal   Collection Time: 03/03/16 12:18 AM  Result Value Ref Range   Glucose-Capillary 126 (H) 65 - 99 mg/dL   Comment 1 Notify RN    Comment 2 Document in Chart   Heparin level (unfractionated)     Status: None   Collection Time: 03/03/16  3:27 AM  Result Value Ref Range   Heparin Unfractionated 0.34 0.30 - 0.70 IU/mL  CBC     Status: Abnormal   Collection Time: 03/03/16  3:27 AM  Result Value Ref Range   WBC 5.6 4.0 - 10.5 K/uL   RBC 3.41 (L) 3.87 - 5.11 MIL/uL   Hemoglobin 10.3 (L) 12.0 - 15.0 g/dL   HCT 16.1 (L) 09.6 - 04.5 %   MCV 93.8 78.0 - 100.0 fL   MCH 30.2 26.0 - 34.0 pg   MCHC 32.2 30.0 - 36.0 g/dL   RDW 40.9 81.1 - 91.4 %   Platelets 120 (L) 150 - 400 K/uL  Glucose, capillary     Status: None   Collection Time: 03/03/16  4:57 AM  Result Value Ref Range   Glucose-Capillary 79 65 - 99 mg/dL   Comment 1  Notify RN    Comment 2 Document in Chart   Glucose, capillary     Status: None   Collection Time: 03/03/16  7:53 AM  Result Value Ref Range   Glucose-Capillary 70 65 - 99 mg/dL  Glucose, capillary     Status: Abnormal   Collection Time: 03/03/16 11:41 AM  Result Value Ref Range   Glucose-Capillary 140 (H) 65 - 99 mg/dL   Dg Chest Port 1v Same Day  Result Date: 03/02/2016 CLINICAL DATA:  Shortness of breath. EXAM: PORTABLE CHEST 1 VIEW COMPARISON:  None. FINDINGS: The heart size and mediastinal contours are within normal limits. Atherosclerosis of thoracic aorta is noted. No pneumothorax is noted. Left lung is clear. Right infrahilar opacity is noted concerning for pneumonia. No significant pleural effusion is noted. The visualized skeletal structures are unremarkable. IMPRESSION: Right infrahilar opacity concerning for pneumonia or edema. Aortic atherosclerosis. Followup PA and lateral chest X-ray is recommended in 3-4 weeks following trial of antibiotic therapy to ensure resolution and exclude underlying malignancy. Electronically Signed   By: Lupita Raider, M.D.   On: 03/02/2016 08:09    Assessment/Plan: Diagnosis: right fronto-parietal  infarct 1. Does the need for close, 24 hr/day medical supervision in concert with the patient's rehab needs make it unreasonable for this patient to be served in a less intensive setting? Yes 2. Co-Morbidities requiring supervision/potential complications: CAD, HTN, DM II all which require monitoring in the acute post-stroke setting 3. Due to bladder management, bowel management, safety, skin/wound care, disease management, medication administration, pain management and patient education, does the patient require 24 hr/day rehab nursing? Yes 4. Does the patient require coordinated care of a physician, rehab nurse, PT (1-2 hrs/day, 5 days/week), OT (1-2 hrs/day, 5 days/week) and SLP (1-2 hrs/day, 5 days/week) to address physical and functional deficits in the  context of the above medical diagnosis(es)? Yes Addressing deficits in the following areas: balance, endurance, locomotion, strength, transferring, bowel/bladder control, bathing, dressing, feeding, grooming, toileting, cognition, speech, swallowing and psychosocial support 5. Can the patient actively participate in an intensive therapy program of at least 3 hrs of therapy per day at least 5 days per week? Yes 6. The potential for patient to make measurable gains while on inpatient rehab is excellent 7. Anticipated functional outcomes upon discharge from inpatient rehab are supervision and min assist  with PT, supervision and min assist with OT, supervision with SLP. 8. Estimated rehab length of stay to reach the above functional goals is: 17-22 days 9. Does the patient have adequate social supports and living environment to accommodate these discharge functional goals? Potentially 10. Anticipated D/C setting: Home 11. Anticipated post D/C treatments: HH therapy and Outpatient therapy 12. Overall Rehab/Functional Prognosis: excellent  RECOMMENDATIONS: This patient's condition is appropriate for continued rehabilitative care in the following setting: CIR Patient has agreed to participate in recommended program. Potentially Note that insurance prior authorization may be required for reimbursement for recommended care.  Comment: Rehab Admissions Coordinator to follow up.  Thanks,  Ranelle Oyster, MD, [place] [facility]    Charlton Amor., PA-C 03/03/2016

## 2016-03-03 NOTE — Progress Notes (Signed)
MEDICATION RELATED NOTE    Pharmacy Re:  Home Meds  Medications:  Prescriptions Prior to Admission  Medication Sig Dispense Refill Last Dose  . beta carotene w/minerals (OCUVITE) tablet Take 1 tablet by mouth daily.   Taking  . Cholecalciferol (VITAMIN D-3 PO) Take 5,000 Int'l Units by mouth daily.   Taking  . gabapentin (NEURONTIN) 300 MG capsule Take 1 capsule (300 mg total) by mouth 2 (two) times daily. 60 capsule 3   . JANUVIA 100 MG tablet Take 100 mg by mouth daily.  5 Taking  . LANTUS SOLOSTAR 100 UNIT/ML Solostar Pen Inject 35 Units into the skin at bedtime.  5 Taking  . levothyroxine (SYNTHROID, LEVOTHROID) 50 MCG tablet TAKE 1 TABLET BY MOUTH DAILY ON EMPTY STOMACH WAIT 1 HOUR BEFORE AKING ANY OTHER MEDS/FOOD/DRINK  5 Taking  . lisinopril (PRINIVIL,ZESTRIL) 5 MG tablet    Taking  . meclizine (ANTIVERT) 25 MG tablet Take 25 mg by mouth 2 (two) times daily as needed for dizziness.   Taking  . metFORMIN (GLUCOPHAGE-XR) 500 MG 24 hr tablet Take 500 mg by mouth 2 (two) times daily.     . Plant Sterols and Stanols (CHOLESTOFF PO) Take by mouth daily.   Taking    Assessment: Home meds reviewed.  Cannot confirm if patient is taking all of these but have included prescriptions that were recent.   Plan:  Have marked record complete - please resume those meds you feel most appropriate for her care.  Nadara MustardNita Keimani Laufer, PharmD., MS Clinical Pharmacist Pager:  [phone] Thank you for allowing pharmacy to be part of this patients care team. 03/03/2016,4:32 PM

## 2016-03-03 NOTE — Progress Notes (Addendum)
PROGRESS NOTE        PATIENT DETAILS Name: Valerie Arnold Age: 83 y.o. Sex: female Date of Birth: 20-Mar-1927 Admit Date: 03/01/2016 Admitting Physician Briscoe Deutscher, MD [id number], Youlanda Mighty, MD  Brief Narrative: Patient is a 83 y.o. female who was admitted to [facility] on 1/28 for acute encephalopathy with NSTEMI. Subsequently found to have acute CVA on the right hemisphere - hospital course was complicated by UTI,aspiration PNA. Carotid doppler at [facility] showed high grade left carotid stenosis.  Transferred to [facility] for neurology and cardiology consult. Cardiology, Neurology, Vascular surgery consulted. - see below for details.   Subjective: Appears weak and very tired this morning.But not in any distress. Denies chest pain, abdominal pain or SOB.   Assessment/Plan: NSTEMI (non-ST elevated myocardial infarction): No current anginal symptoms. Cardiology at [facility] planned to pursue conservative treatment. 2D echocardiogram showed EF 55-60% (done at [facility]). Troponin continues to trend down. Continue aspirin, Ranexa, statin, beta blocker. Seen by cardiology on 2/1-Dr. Delton See recommends that we continue with conservative management, and only pursue invasive workup once she recovers, or has recurrent symptoms.  Atrial Fibrillation RVR: Occurred on night of 03/01/16 - back in sinus rhythm. Started on metoprolol and heparin per pharmacy. CHADS2VASC of 8. Continue IV heparin per pharmacy,will need to be converted to a oral agent when we are sure that the patient does not need any procedures or surgery while inpatient.Cardiology following.  Acute CVA: Admitted to [facility] for weakness and confusion - originally attributed to UTI. CT head was negative, confusion persistened. MRI of brain showed acute right frontoparietal junction infarct. She does not have focal neurological deficits on exam. Carotid Doppler (at [facility]) revealed high grade left carotid bifurcation proximal ICA stenosis with near occlusion. Seen by vascular surgery on 2/1, repeat carotid Doppler done at [facility] shows around 60-79% stenosis in the right carotid as well, and 80-99% stenosis in the left carotid artery. Echocardiogram showed preserved EF. A1c was 6.6, LDL 105. Currently on ASA, lipitor 40 mg and IV heparin - continue. Vascular surgery following, recommendations are to pursue a CT angiogram of the neck. Await further recommendations from the stroke team. We'll likely require SNF on discharge.  High - grade, left OCA stenosis with moderate right ICA stenosis: Carotid Doppler done at [facility] showed left carotid artery high-grade stenosis-since her stroke was in the right cerebral hemisphere this was initially thought to be asymptomatic lesion. However a repeat Doppler done at [facility] on 2/1 shows significant stenosis (60-79%) on the right ICA as well. Vascular surgery following, recommending CT angiogram. Vascular surgery contemplating intervention once patient has functionally recovered.  Probable Aspiration PNA: She is afebrile, although she is deconditioned and weak and she does not appear toxic. Evaluated by speech therapy,started on a dysphagia 2 diet. Spoke with family on 2/1, they're aware of aspiration risks. We'll stop vancomycin today, continue with Zosyn for a few more days.   Dysphagia: Probably due to CVA, generalized weakness. Evaluated by speech therapy on 2/1, subsequently started on a dysphagia 2 diet. Speech therapy following, planning on a modified barium swallow today.  Dyslipidemia: Continue Lipitor 40 mg. Repeat LDL in 3 mo.   Hypertension: Controlled - continue metoprolol.   Oral Candidiasis ([place]): Continue nystatin and fluconazole.    Type II diabetes mellitus: HgbA1C 6.6 on  03/02/16.CBGs stable, continue 30 units of Lantus daily at bedtime and SSI.  Hypothyroidism: Continue  levothyroxine.   Generalized weakness/physical deconditioning: Secondary to acute illness-suspect will require SNF on discharge. Family agreeable.  DVT Prophylaxis: Prophylactic Heparin  Code Status: DNR  Family Communication: Spoke one daughter at bedside with another daughter over speaker phone  Disposition Plan: Remain inpatient  Antimicrobial agents: Anti-infectives    Start     Dose/Rate Route Frequency Ordered Stop   03/02/16 2300  fluconazole (DIFLUCAN) IVPB 100 mg     100 mg 50 mL/hr over 60 Minutes Intravenous Every 24 hours 03/02/16 0003     03/02/16 1200  vancomycin (VANCOCIN) IVPB 750 mg/150 ml premix     750 mg 150 mL/hr over 60 Minutes Intravenous Every 12 hours 03/02/16 0005     03/02/16 0600  piperacillin-tazobactam (ZOSYN) IVPB 3.375 g     3.375 g 12.5 mL/hr over 240 Minutes Intravenous Every 8 hours 03/02/16 0005     03/02/16 0015  fluconazole (DIFLUCAN) IVPB 400 mg     400 mg 100 mL/hr over 120 Minutes Intravenous  Once 03/01/16 2338 03/02/16 0242   03/02/16 0015  vancomycin (VANCOCIN) IVPB 750 mg/150 ml premix     750 mg 150 mL/hr over 60 Minutes Intravenous  Once 03/02/16 0005 03/02/16 0140      Procedures: None at this time  CONSULTS: Cardiology Vascular Surgery, Dr. Randie Heinz Neurology  Time spent: 25 minutes-Greater than 50% of this time was spent in counseling, explanation of diagnosis, planning of further management, and coordination of care.  MEDICATIONS: Scheduled Meds: . aspirin  325 mg Oral Daily  . atorvastatin  40 mg Oral q1800  . cholecalciferol  5,000 Units Oral Daily  . fluconazole (DIFLUCAN) IV  100 mg Intravenous Q24H  . gabapentin  300 mg Oral BID  . insulin aspart  0-9 Units Subcutaneous Q4H  . insulin glargine  30 Units Subcutaneous QHS  . levothyroxine  50 mcg Oral QAC breakfast  . metoprolol tartrate  12.5 mg Oral BID  . nystatin  5 mL Mouth/Throat QID  . piperacillin-tazobactam (ZOSYN)  IV  3.375 g Intravenous Q8H  .  ranolazine  500 mg Oral BID  . vancomycin  750 mg Intravenous Q12H   Continuous Infusions: . sodium chloride 75 mL/hr at 03/01/16 2345  . heparin 850 Units/hr (03/02/16 1137)   PRN Meds:.acetaminophen **OR** acetaminophen (TYLENOL) oral liquid 160 mg/5 mL **OR** acetaminophen, meclizine, metoprolol, morphine injection, senna-docusate   PHYSICAL EXAM: Vital signs: Vitals:   03/02/16 1410 03/02/16 2100 03/03/16 0151 03/03/16 0500  BP: (!) 154/54 (!) 159/59 (!) 119/93 (!) 139/51  Pulse: 72 78 66 68  Resp: 16 16 18 18   Temp: 99.2 F (37.3 C) 98.9 F (37.2 C) 98.6 F (37 C) 99.1 F (37.3 C)  TempSrc: Oral Oral Oral Axillary  SpO2: 96% 95% 93% 93%  Weight:      Height:       Filed Weights   03/01/16 2143  Weight: 76.5 kg (168 lb 9.6 oz)   Body mass index is 28.94 kg/m.   General appearance: Awake, alert, not in any distress. Speech Clear. Not toxic Looking Eyes: PERRLA,no scleral icterus.Pink conjunctiva HEENT: Atraumatic and Normocephalic Neck: Supple, no JVD. No cervical lymphadenopathy. No thyromegaly Resp:Good air entry bilaterally, no added sounds  CVS: S1 S2 regular, no murmurs.  GI: Bowel sounds present, Non tender and not distended with no gaurding, rigidity or rebound.No organomegaly Extremities: B/L Lower Ext shows no edema, both  legs are warm to touch Neurology:  Speech clear,Non focal, sensation is grossly intact. Psychiatric: Normal judgment and insight. AAO x 3. Normal mood. Musculoskeletal: No digital cyanosis Skin: No Rash, warm and dry Wounds: N/A  I have personally reviewed following labs and imaging studies  LABORATORY DATA: CBC:  Recent Labs Lab 03/02/16 0332 03/03/16 0327  WBC 8.0 5.6  NEUTROABS 5.9  --   HGB 11.1* 10.3*  HCT 33.8* 32.0*  MCV 93.4 93.8  PLT 116* 120*    Basic Metabolic Panel:  Recent Labs Lab 03/02/16 0332  NA 138  K 3.4*  CL 102  CO2 25  GLUCOSE 245*  BUN 11  CREATININE 0.97  CALCIUM 8.1*     GFR: Estimated Creatinine Clearance: 41.7 mL/min (by C-G formula based on SCr of 0.97 mg/dL).  Liver Function Tests:  Recent Labs Lab 03/02/16 0332  AST 35  ALT 18  ALKPHOS 39  BILITOT 0.9  PROT 5.3*  ALBUMIN 2.6*   No results for input(s): LIPASE, AMYLASE in the last 168 hours. No results for input(s): AMMONIA in the last 168 hours.  Coagulation Profile: No results for input(s): INR, PROTIME in the last 168 hours.  Cardiac Enzymes:  Recent Labs Lab 03/02/16 0332 03/02/16 1014 03/02/16 1627  TROPONINI 1.52* 2.46* 2.38*    BNP (last 3 results) No results for input(s): PROBNP in the last 8760 hours.  HbA1C:  Recent Labs  03/02/16 0332  HGBA1C 6.6*    CBG:  Recent Labs Lab 03/02/16 1658 03/02/16 2008 03/03/16 0018 03/03/16 0457 03/03/16 0753  GLUCAP 145* 205* 126* 79 70    Lipid Profile:  Recent Labs  03/02/16 0332  CHOL 161  HDL 27*  LDLCALC 100*  TRIG 172*  CHOLHDL 6.0    Thyroid Function Tests: No results for input(s): TSH, T4TOTAL, FREET4, T3FREE, THYROIDAB in the last 72 hours.  Anemia Panel: No results for input(s): VITAMINB12, FOLATE, FERRITIN, TIBC, IRON, RETICCTPCT in the last 72 hours.  Urine analysis: No results found for: COLORURINE, APPEARANCEUR, LABSPEC, PHURINE, GLUCOSEU, HGBUR, BILIRUBINUR, KETONESUR, PROTEINUR, UROBILINOGEN, NITRITE, LEUKOCYTESUR  Sepsis Labs: Lactic Acid, Venous No results found for: LATICACIDVEN  MICROBIOLOGY: No results found for this or any previous visit (from the past 240 hour(s)).  RADIOLOGY STUDIES/RESULTS: Dg Chest Port 1v Same Day  Result Date: 03/02/2016 CLINICAL DATA:  Shortness of breath. EXAM: PORTABLE CHEST 1 VIEW COMPARISON:  None. FINDINGS: The heart size and mediastinal contours are within normal limits. Atherosclerosis of thoracic aorta is noted. No pneumothorax is noted. Left lung is clear. Right infrahilar opacity is noted concerning for pneumonia. No significant pleural  effusion is noted. The visualized skeletal structures are unremarkable. IMPRESSION: Right infrahilar opacity concerning for pneumonia or edema. Aortic atherosclerosis. Followup PA and lateral chest X-ray is recommended in 3-4 weeks following trial of antibiotic therapy to ensure resolution and exclude underlying malignancy. Electronically Signed   By: Lupita Raider, M.D.   On: 03/02/2016 08:09     LOS: 2 days   Rose Clousing, PAS  [employer]  If 7PM-7AM, please contact night-coverage [email or web address] Password [facility] 03/03/2016, 9:04 AM  Attending MD note Patient was seen, examined,treatment plan was discussed with the PA-S.  I have personally reviewed the clinical findings, lab, imaging studies and management of this patient in detail. I agree with the documentation, as recorded by the PA-S.   Patient is essentially unchanged. She denied any chest pain or shortness of breath this morning. Sleeping comfortably. Her daughter was at bedside  On Exam: Gen. exam: Awake, alert, not in any distress Chest: Good air entry bilaterally, no rhonchi or rales CVS: S1-S2 regular, no murmurs Abdomen: Soft, nontender and nondistended Neurology: Non-focal Skin: No rash or lesions  Assessment and plan: Non-STEMI-cardiology planning medical management.  A. fib RVR: Back to sinus rhythm. On metoprolol, IV heparin for pharmacy. Plans to transition to oral anticoagulants over the next few days when it is clear that she does not require any surgery/procedures.  Acute CVA: Nonfocal exam, initially thought to be secondary to proximal atrial fibrillation, however repeat carotid Doppler-shows right carotid pulses as well. [facility] only showed left sided disease. Vascular surgery recommending CT angiogram. Neurology following.  Aspiration pneumonia: Stop vancomycin, continue Zosyn.  Dysphagia: Likely secondary to CVA/generalized weakness-dysphagia 2 diet and place-spoke with family on  2/1-they're aware of aspiration risk. Speech therapy following and planning on 15 barium swallow  Generalized deconditioning/weakness: We'll likely require SNF. Family agreeable.  Rest as above  [facility] AscGHIMIRE,Jihan Rudy [facility]

## 2016-03-03 NOTE — Progress Notes (Signed)
Speech Language Pathology Treatment: Dysphagia  Patient Details Name: Valerie MylarMargie Narasimhan MRN: [id number] DOB: 12/25/1927 Today's Date: 03/03/2016 Time: [id number] SLP Time Calculation (min) (ACUTE ONLY): 15 min  Assessment / Plan / Recommendation Clinical Impression  Pt observed during medication administration, with RN providing meds whole in puree, followed by liquid wash. Pt had an immediate cough follow straw sips of thin liquids x2, otherwise without overt s/s of aspiration. Given intermittent coughing and RLL PNA, recommend to proceed with instrumental testing to better assess aspiration risk.    HPI HPI: 83 y.o.femalewith medical history significant for insulin-dependent diabetes mellitus, anxiety, depression, and hypertension who [facility] on 02/27/2016 for evaluation of several days of generalized weakness, altered mental status, and hypotension. She was treated for UTI without improved mentation. MRI 1/29 showed acute infarct in the right frontoparietal junction. CXR 1/30 showed new opacity in the RLL consistent with PNA or aspiration. Pt has been treated for oral candidiasis and PNA. Daughter reports coughing and difficulty swallowing solids with odnyophagia.       SLP Plan  MBS     Recommendations  Diet recommendations: Dysphagia 2 (fine chop);Thin liquid Liquids provided via: Cup;Straw Medication Administration: Whole meds with puree Supervision: Patient able to self feed;Full supervision/cueing for compensatory strategies Compensations: Slow rate;Minimize environmental distractions;Small sips/bites Postural Changes and/or Swallow Maneuvers: Seated upright 90 degrees;Upright 30-60 min after meal                Oral Care Recommendations: Oral care BID Follow up Recommendations:  (tba) Plan: MBS       GO                Maxcine Hamaiewonsky, Dantavious Snowball 03/03/2016, 10:19 AM  Maxcine HamLaura Paiewonsky, M.A. CCC-SLP [phone]

## 2016-03-03 NOTE — Progress Notes (Signed)
Per MD, patient's foley will not be discontinued. Will continue to monitor.

## 2016-03-03 NOTE — Evaluation (Signed)
Physical Therapy Evaluation Patient Details Name: Valerie Arnold MRN: [id number] DOB: 02-Mar-1927 Today's Date: 03/03/2016   History of Present Illness  Pt is an 83 y.o.female who was admitted to [facility] on 1/28 for acute encephalopathy with NSTEMI. Subsequently found to have acute CVA on the right hemisphere ( right frontoparietal junction)- hospital course was complicated by aspiration PNA. Transferred to [facility] for neurology and cardiology consult. Repeat carotid doppler at [facility] showed 40-59% right ICA stenosis, with high grade left occipital carotid artery stenosis.  Clinical Impression  Pt admitted with above diagnosis. Pt currently with functional limitations due to the deficits listed below (see PT Problem List). At the time of PT eval pt was able to perform transfers with +2 mod assist for balance support and safety. Pt very lethargic at times, with difficulty keeping eyes open while sitting EOB. Feel this patient would be appropriate for CIR to maximize independence prior to return home with family support. Pt will benefit from skilled PT to increase their independence and safety with mobility to allow discharge to the venue listed below.       Follow Up Recommendations CIR;Supervision/Assistance - 24 hour    Equipment Recommendations  None recommended by PT (TBD by next venue of care)    Recommendations for Other Services Rehab consult     Precautions / Restrictions Precautions Precautions: Fall Restrictions Weight Bearing Restrictions: No      Mobility  Bed Mobility Overal bed mobility: Needs Assistance Bed Mobility: Supine to Sit     Supine to sit: Mod assist;+2 for physical assistance     General bed mobility comments: Frequent cueing required for completion of task. Pt reports she forgot what we wanted her to do in the middle of transfer.   Transfers Overall transfer level: Needs assistance Equipment used: 2 person hand held assist Transfers: Sit to/from  [employer] Sit to Stand: +2 physical assistance;Mod assist Stand pivot transfers: Mod assist;+2 physical assistance       General transfer comment: VC's for general safety. Pt required increased time to achieve sit<>stand, and to advance LE's around for pivotal steps to chair.   Ambulation/Gait             General Gait Details: Unable at this time  Stairs            Wheelchair Mobility    Modified Rankin (Stroke Patients Only) Modified Rankin (Stroke Patients Only) Pre-Morbid Rankin Score: No symptoms Modified Rankin: Severe disability     Balance Overall balance assessment: Needs assistance Sitting-balance support: Feet supported;Bilateral upper extremity supported Sitting balance-Leahy Scale: Poor Sitting balance - Comments: R bias     Standing balance-Leahy Scale: Poor Standing balance comment: posteiror bias                             Pertinent Vitals/Pain Pain Assessment: No/denies pain    Home Living Family/patient expects to be discharged to:: Private residence Living Arrangements: Spouse/significant other;Children Available Help at Discharge: Available 24 hours/day Type of Home: House Home Access: Ramped entrance     Home Layout: One level Home Equipment: Environmental consultant - 4 wheels;Shower seat;Cane - single point      Prior Function Level of Independence: Needs assistance   Gait / Transfers Assistance Needed: used rollator independently  ADL's / Homemaking Assistance Needed: pt independent with ADL.Family assisted with meals and cleaning. pt drove        Hand Dominance   Dominant Hand:  Right    Extremity/Trunk Assessment   Upper Extremity Assessment Upper Extremity Assessment: Defer to OT evaluation RUE Deficits / Details: generalized weakness LUE Deficits / Details: generalized weakness. Isolated movement patterns. question decreased sensation LUE Sensation: decreased light touch;decreased proprioception LUE  Coordination: decreased fine motor;decreased gross motor    Lower Extremity Assessment Lower Extremity Assessment: Generalized weakness;RLE deficits/detail;LLE deficits/detail RLE Deficits / Details: Appeared to have difficulty advancing in standing.  LLE Deficits / Details: Decreased strength and AROM      Cervical / Trunk Assessment Cervical / Trunk Assessment: Other exceptions Cervical / Trunk Exceptions: R bias. posterior lean  Communication   Communication: No difficulties  Cognition Arousal/Alertness: Lethargic Behavior During Therapy: Flat affect Overall Cognitive Status: Impaired/Different from baseline Area of Impairment: Attention;Memory;Following commands;Safety/judgement;Awareness;Problem solving   Current Attention Level: Sustained Memory: Decreased short-term memory Following Commands: Follows one step commands with increased time Safety/Judgement: Decreased awareness of safety;Decreased awareness of deficits Awareness: Intellectual Problem Solving: Slow processing;Decreased initiation;Difficulty sequencing;Requires verbal cues      General Comments      Exercises     Assessment/Plan    PT Assessment Patient needs continued PT services  PT Problem List Decreased strength;Decreased range of motion;Decreased activity tolerance;Decreased balance;Decreased mobility;Decreased knowledge of use of DME;Decreased knowledge of precautions;Decreased safety awareness;Pain          PT Treatment Interventions DME instruction;Gait training;Stair training;Functional mobility training;Therapeutic activities;Therapeutic exercise;Neuromuscular re-education;Patient/family education    PT Goals (Current goals can be found in the Care Plan section)  Acute Rehab PT Goals Patient Stated Goal: per family - to get better PT Goal Formulation: With patient/family Time For Goal Achievement: 03/17/16 Potential to Achieve Goals: Good    Frequency Min 4X/week   Barriers to  discharge        Co-evaluation PT/OT/SLP Co-Evaluation/Treatment: Yes Reason for Co-Treatment: Complexity of the patient's impairments (multi-system involvement);For patient/therapist safety;To address functional/ADL transfers PT goals addressed during session: Mobility/safety with mobility;Balance OT goals addressed during session: ADL's and self-care       End of Session Equipment Utilized During Treatment: Gait belt Activity Tolerance: Patient limited by fatigue;Patient limited by lethargy Patient left: in chair;with call bell/phone within reach;with chair alarm set;with family/visitor present Nurse Communication: Mobility status         Time: 1133-1208 PT Time Calculation (min) (ACUTE ONLY): 35 min   Charges:   PT Evaluation $PT Eval Moderate Complexity: 1 Procedure     PT G Codes:        Marylynn PearsonLaura D Natha Guin 03/03/2016, 2:21 PM  Conni SlipperLaura Fayne Mcguffee, PT, DPT Acute Rehabilitation Services Pager: [phone]

## 2016-03-04 LAB — GLUCOSE, CAPILLARY
GLUCOSE-CAPILLARY: 29 mg/dL — AB (ref 65–99)
GLUCOSE-CAPILLARY: 79 mg/dL (ref 65–99)
GLUCOSE-CAPILLARY: 96 mg/dL (ref 65–99)
GLUCOSE-CAPILLARY: 82 mg/dL (ref 65–99)
Glucose-Capillary: 134 mg/dL — ABNORMAL HIGH (ref 65–99)
Glucose-Capillary: 150 mg/dL — ABNORMAL HIGH (ref 65–99)
Glucose-Capillary: 44 mg/dL — CL (ref 65–99)

## 2016-03-04 LAB — VAS US CAROTID
LCCADDIAS: -26 cm/s
LCCAPSYS: 68 cm/s
LEFT VERTEBRAL DIAS: 34 cm/s
LICADDIAS: -88 cm/s
Left CCA dist sys: -85 cm/s
Left CCA prox dias: 24 cm/s
Left ICA dist sys: -261 cm/s
Left ICA prox dias: -35 cm/s
Left ICA prox sys: -123 cm/s
RCCADSYS: -91 cm/s
RIGHT ECA DIAS: 25 cm/s
Right CCA prox dias: 17 cm/s
Right CCA prox sys: 80 cm/s

## 2016-03-04 LAB — CBC
HCT: 34.1 % — ABNORMAL LOW (ref 36.0–46.0)
Hemoglobin: 11.1 g/dL — ABNORMAL LOW (ref 12.0–15.0)
MCH: 30.6 pg (ref 26.0–34.0)
MCHC: 32.6 g/dL (ref 30.0–36.0)
MCV: 93.9 fL (ref 78.0–100.0)
Platelets: 139 10*3/uL — ABNORMAL LOW (ref 150–400)
RBC: 3.63 MIL/uL — AB (ref 3.87–5.11)
RDW: 13.3 % (ref 11.5–15.5)
WBC: 6.6 10*3/uL (ref 4.0–10.5)

## 2016-03-04 LAB — BASIC METABOLIC PANEL
Anion gap: 11 (ref 5–15)
BUN: 7 mg/dL (ref 6–20)
CHLORIDE: 106 mmol/L (ref 101–111)
CO2: 27 mmol/L (ref 22–32)
CREATININE: 1 mg/dL (ref 0.44–1.00)
Calcium: 8.7 mg/dL — ABNORMAL LOW (ref 8.9–10.3)
GFR calc Af Amer: 57 mL/min — ABNORMAL LOW (ref >60)
GFR, EST NON AFRICAN AMERICAN: 50 mL/min — AB (ref >60)
GLUCOSE: 55 mg/dL — AB (ref 65–99)
POTASSIUM: 3.3 mmol/L — AB (ref 3.5–5.1)
SODIUM: 144 mmol/L (ref 135–145)

## 2016-03-04 LAB — HEPARIN LEVEL (UNFRACTIONATED): Heparin Unfractionated: 0.32 IU/mL (ref 0.30–0.70)

## 2016-03-04 MED ORDER — INSULIN GLARGINE 100 UNIT/ML ~~LOC~~ SOLN
18.0000 [IU] | Freq: Every day | SUBCUTANEOUS | Status: DC
  Filled 2016-03-04: qty 0.18

## 2016-03-04 MED ORDER — POTASSIUM CHLORIDE CRYS ER 20 MEQ PO TBCR
40.0000 meq | EXTENDED_RELEASE_TABLET | Freq: Four times a day (QID) | ORAL | Status: AC
  Administered 2016-03-04 (×2): 40 meq via ORAL
  Filled 2016-03-04 (×2): qty 2

## 2016-03-04 MED ORDER — APIXABAN 5 MG PO TABS
5.0000 mg | ORAL_TABLET | Freq: Two times a day (BID) | ORAL | Status: DC
  Administered 2016-03-04 – 2016-03-06 (×5): 5 mg via ORAL
  Filled 2016-03-04 (×5): qty 1

## 2016-03-04 MED ORDER — TAMSULOSIN HCL 0.4 MG PO CAPS
0.4000 mg | ORAL_CAPSULE | Freq: Every day | ORAL | Status: DC
  Administered 2016-03-04 – 2016-03-06 (×3): 0.4 mg via ORAL
  Filled 2016-03-04 (×3): qty 1

## 2016-03-04 MED ORDER — POTASSIUM CHLORIDE 20 MEQ PO PACK
20.0000 meq | PACK | Freq: Two times a day (BID) | ORAL | Status: DC
  Administered 2016-03-04: 20 meq via ORAL
  Filled 2016-03-04 (×2): qty 1

## 2016-03-04 NOTE — Progress Notes (Signed)
Subjective: Interval History: none.. Sitting up in her bed at taking her pills this morning. Having low blood sugar. Family present. No new focal neurologic deficits  Objective: Vital signs in last 24 hours: Temp:  [98 F (36.7 C)-98.9 F (37.2 C)] 98.5 F (36.9 C) (02/03 0907) Pulse Rate:  [61-69] 65 (02/03 0907) Resp:  [16-20] 16 (02/03 0907) BP: (133-171)/(55-66) 170/66 (02/03 0907) SpO2:  [93 %-98 %] 95 % (02/03 0907)  Intake/Output from previous day: 02/02 0701 - 02/03 0700 In: 890.6 [P.O.:360; I.V.:330.6; IV Piggyback:200] Out: 850 [Urine:850] Intake/Output this shift: Total I/O In: 360 [P.O.:360] Out: -   No new focal deficits  Lab Results:  Recent Labs  03/03/16 0327 03/04/16 0535  WBC 5.6 6.6  HGB 10.3* 11.1*  HCT 32.0* 34.1*  PLT 120* 139*   BMET  Recent Labs  03/02/16 0332 03/04/16 0303  NA 138 144  K 3.4* 3.3*  CL 102 106  CO2 25 27  GLUCOSE 245* 55*  BUN 11 7  CREATININE 0.97 1.00  CALCIUM 8.1* 8.7*    Studies/Results: Ct Angio Head W Or Wo Contrast  Result Date: 03/03/2016 CLINICAL DATA:  Initial evaluation for carotid artery stenosis. EXAM: CT ANGIOGRAPHY HEAD AND NECK TECHNIQUE: Multidetector CT imaging of the head and neck was performed using the standard protocol during bolus administration of intravenous contrast. Multiplanar CT image reconstructions and MIPs were obtained to evaluate the vascular anatomy. Carotid stenosis measurements (when applicable) are obtained utilizing NASCET criteria, using the distal internal carotid diameter as the denominator. CONTRAST:  50 cc of Isovue 370. COMPARISON:  None. FINDINGS: CT HEAD FINDINGS Brain: Atrophy with moderate chronic microvascular ischemic disease. Remote lacunar infarct present within the left caudate head. No acute intracranial hemorrhage. No evidence for acute large vessel territory infarct. Previous identified small infarct within the right frontal parietal region not seen. No mass lesion,  midline shift or mass effect. Ventricular prominence related to global parenchymal volume loss without hydrocephalus. No extra-axial fluid collection. Vascular: No hyperdense vessel. Scattered vascular calcifications noted within the carotid siphons. Skull: Scalp soft tissues within normal limits.  Calvarium intact. Sinuses: Scattered mucosal thickening within the ethmoidal air cells, sphenoid sinuses, and maxillary sinuses. Trace right mastoid effusion. Orbits: Globes and orbital soft tissues within normal limits. Patient is status post lens extraction bilaterally. CTA NECK FINDINGS Aortic arch: Extensive atheromatous irregularity throughout the aortic arch. Arch itself of normal caliber with normal 3 vessel morphology. Atheromatous plaque about the origin of the great vessels. There is moderate narrowing of approximately 50% at the proximal right brachiocephalic artery (series 11, image 3 weight). No other significant stenosis at the origin of the great vessels. Extensive plaque throughout the visualized subclavian arteries without significant stenosis. Right carotid system: Scattered eccentric calcified plaque throughout the right common carotid artery without flow-limiting stenosis. Multifocal atheromatous stenoses at the proximal right subclavian artery measuring up to 50% by NASCET criteria area of involved extends from the carotid bifurcation and measures approximately 16 mm in length distally, right ICA tortuous but patent to the skullbase without additional stenosis or other vascular abnormality. Left carotid system: Scattered eccentric plaque within the left common carotid artery without flow-limiting stenosis. Calcified and noncalcified plaque at the left carotid bifurcation/proximal left ICA with associated severe near occlusive high-grade stenosis (greater than 90%). A string sign is present (series 12, image 117). Area involvement extends from the carotid bifurcation in measures approximately 21 mm in  length. Distally, left ICA tortuous but widely patent to the  skullbase without additional stenosis or other acute vascular abnormality. No made of moderate to severe narrowing at the origin of the left external carotid artery is well. Vertebral arteries: Both of the vertebral arteries arise from the subclavian arteries. Severe stenosis at the origin of the right vertebral artery (series 11, image 261). More moderate narrowing at the origin of the left vertebral artery. Left vertebral artery is dominant. There are additional multifocal moderate to severe stenoses within the right V1/V2 segments (series 11, image 228, 214). Additional atheromatous irregularity within the hypoplastic right vertebral artery distally without severe stenosis. Multifocal plaque present throughout the left V2 and V3 segments with mild to moderate multifocal narrowing. No high-grade flow-limiting stenosis. Skeleton: No acute osseous abnormality. No worrisome lytic or blastic osseous lesions. Moderate multilevel degenerative spondylolysis noted within the cervical spine. Prominent broad posterior disc protrusion noted at C4-5 with resultant and least moderate stenosis. Other neck: Visualized soft tissues of the neck demonstrate no acute abnormality. No adenopathy. Thyroid within normal limits. Upper chest: Few mildly prominent pretracheal lymph nodes measure at the upper limits of normal at 1 cm in short axis. Mild esophageal wall thickening, which may be related to reflux disease or possibly acute esophagitis. Small layering bilateral pleural effusions with associated atelectatic changes. Patchy opacity within the superior segment right lower lobe may reflect infiltrate. Review of the MIP images confirms the above findings CTA HEAD FINDINGS Anterior circulation: Petrous segments widely patent bilaterally. Scattered atheromatous plaque throughout the carotid siphons with mild to moderate diffuse narrowing. ICA termini widely patent. Left A1  segment widely patent. Right A1 segment hypoplastic and/ or absent, likely accounting for the slightly diminutive right ICA is compared to the left. Anterior communicating artery normal. Short-segment severe left A2 stenosis noted (series 11, image 8). ACA is otherwise patent to their distal aspects. For Short-segment severe proximal left M1 stenosis, extending from the terminus and measuring 5 mm in length (series 11, image 96). Left M1 segment otherwise widely patent. Small vessel atheromatous irregularity throughout the left MCA branches. Right M1 segment irregular with moderate diffuse narrowing distally. Several proximal moderate to severe right M2 stenoses noted. Small vessel irregularity throughout the right MCA branches, which are well perfused and fairly symmetric with the left. Posterior circulation: Scattered multifocal plaque within the dominant left vertebral artery as it crosses the dural margin. Associated mild to moderate multifocal narrowing. Hypoplastic right vertebral artery irregular and terminates in PICA. Posterior inferior cerebral arteries themselves are patent proximally. Basilar artery diminutive and mildly irregular but patent to its distal aspect without high-grade stenosis. Superior cerebral arteries grossly patent. Fetal type right PCA supplied via a new right posterior communicating artery. Left PCA supplied via the basilar artery. Extensive atheromatous irregularity throughout the P2 segments with multifocal moderate to severe stenoses. Additional atheromatous irregularity within the distal PCA branches. Right PCA is not well seen distally, and is attenuated as compared to the left. Venous sinuses: Patent. Venous collateralization into the posterior aspect noted from the proximal right internal jugular vein. Anatomic variants: No significant anatomic variant. No aneurysm or vascular malformation. Delayed phase: No pathologic enhancement. Review of the MIP images confirms the above  findings IMPRESSION: CTA NECK IMPRESSION: 1. Severe near occlusive stenosis involving the proximal left ICA of greater than 90%. Stenosis begins at the bifurcation, and measures approximately 21 mm in length. 2. Multifocal atheromatous stenoses involving the proximal right ICA of up to 50%. Area affected begins at the right carotid bifurcation, and measures approximately 16 mm  in length. 3. Multifocal severe proximal right vertebral artery stenoses as above, with more mild to moderate narrowing throughout the left vertebral artery. Left vertebral artery is dominant. 4. 50% stenosis at the proximal right brachiocephalic artery. No other high-grade stenosis about the origin of the great vessels. 5. Layering bilateral pleural effusions with associated atelectasis. Possible superimposed infiltrate within the superior segment of the right lower lobe. CTA HEAD IMPRESSION: 1. 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 2. Extensive atheromatous disease throughout the posterior circulation as above, most notable within the bilateral PCAs. Dominant left vertebral artery and basilar artery are patent but irregular with multifocal stenoses as above. 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 Electronically Signed   By: Rise Mu M.D.   On: 03/03/2016 18:59   Ct Angio Neck W Or Wo Contrast  Result Date: 03/03/2016 CLINICAL DATA:  Initial evaluation for carotid artery stenosis. EXAM: CT ANGIOGRAPHY HEAD AND NECK TECHNIQUE: Multidetector CT imaging of the head and neck was performed using the standard protocol during bolus administration of intravenous contrast. Multiplanar CT image reconstructions and MIPs were obtained to evaluate the vascular anatomy. Carotid stenosis measurements (when applicable) are obtained utilizing NASCET criteria, using the distal internal carotid diameter as the denominator.  CONTRAST:  50 cc of Isovue 370. COMPARISON:  None. FINDINGS: CT HEAD FINDINGS Brain: Atrophy with moderate chronic microvascular ischemic disease. Remote lacunar infarct present within the left caudate head. No acute intracranial hemorrhage. No evidence for acute large vessel territory infarct. Previous identified small infarct within the right frontal parietal region not seen. No mass lesion, midline shift or mass effect. Ventricular prominence related to global parenchymal volume loss without hydrocephalus. No extra-axial fluid collection. Vascular: No hyperdense vessel. Scattered vascular calcifications noted within the carotid siphons. Skull: Scalp soft tissues within normal limits.  Calvarium intact. Sinuses: Scattered mucosal thickening within the ethmoidal air cells, sphenoid sinuses, and maxillary sinuses. Trace right mastoid effusion. Orbits: Globes and orbital soft tissues within normal limits. Patient is status post lens extraction bilaterally. CTA NECK FINDINGS Aortic arch: Extensive atheromatous irregularity throughout the aortic arch. Arch itself of normal caliber with normal 3 vessel morphology. Atheromatous plaque about the origin of the great vessels. There is moderate narrowing of approximately 50% at the proximal right brachiocephalic artery (series 11, image 3 weight). No other significant stenosis at the origin of the great vessels. Extensive plaque throughout the visualized subclavian arteries without significant stenosis. Right carotid system: Scattered eccentric calcified plaque throughout the right common carotid artery without flow-limiting stenosis. Multifocal atheromatous stenoses at the proximal right subclavian artery measuring up to 50% by NASCET criteria area of involved extends from the carotid bifurcation and measures approximately 16 mm in length distally, right ICA tortuous but patent to the skullbase without additional stenosis or other vascular abnormality. Left carotid system:  Scattered eccentric plaque within the left common carotid artery without flow-limiting stenosis. Calcified and noncalcified plaque at the left carotid bifurcation/proximal left ICA with associated severe near occlusive high-grade stenosis (greater than 90%). A string sign is present (series 12, image 117). Area involvement extends from the carotid bifurcation in measures approximately 21 mm in length. Distally, left ICA tortuous but widely patent to the skullbase without additional stenosis or other acute vascular abnormality. No made of moderate to severe narrowing at the origin of the left external carotid artery is well. Vertebral arteries: Both of the vertebral arteries arise from the subclavian arteries.  Severe stenosis at the origin of the right vertebral artery (series 11, image 261). More moderate narrowing at the origin of the left vertebral artery. Left vertebral artery is dominant. There are additional multifocal moderate to severe stenoses within the right V1/V2 segments (series 11, image 228, 214). Additional atheromatous irregularity within the hypoplastic right vertebral artery distally without severe stenosis. Multifocal plaque present throughout the left V2 and V3 segments with mild to moderate multifocal narrowing. No high-grade flow-limiting stenosis. Skeleton: No acute osseous abnormality. No worrisome lytic or blastic osseous lesions. Moderate multilevel degenerative spondylolysis noted within the cervical spine. Prominent broad posterior disc protrusion noted at C4-5 with resultant and least moderate stenosis. Other neck: Visualized soft tissues of the neck demonstrate no acute abnormality. No adenopathy. Thyroid within normal limits. Upper chest: Few mildly prominent pretracheal lymph nodes measure at the upper limits of normal at 1 cm in short axis. Mild esophageal wall thickening, which may be related to reflux disease or possibly acute esophagitis. Small layering bilateral pleural effusions  with associated atelectatic changes. Patchy opacity within the superior segment right lower lobe may reflect infiltrate. Review of the MIP images confirms the above findings CTA HEAD FINDINGS Anterior circulation: Petrous segments widely patent bilaterally. Scattered atheromatous plaque throughout the carotid siphons with mild to moderate diffuse narrowing. ICA termini widely patent. Left A1 segment widely patent. Right A1 segment hypoplastic and/ or absent, likely accounting for the slightly diminutive right ICA is compared to the left. Anterior communicating artery normal. Short-segment severe left A2 stenosis noted (series 11, image 8). ACA is otherwise patent to their distal aspects. For Short-segment severe proximal left M1 stenosis, extending from the terminus and measuring 5 mm in length (series 11, image 96). Left M1 segment otherwise widely patent. Small vessel atheromatous irregularity throughout the left MCA branches. Right M1 segment irregular with moderate diffuse narrowing distally. Several proximal moderate to severe right M2 stenoses noted. Small vessel irregularity throughout the right MCA branches, which are well perfused and fairly symmetric with the left. Posterior circulation: Scattered multifocal plaque within the dominant left vertebral artery as it crosses the dural margin. Associated mild to moderate multifocal narrowing. Hypoplastic right vertebral artery irregular and terminates in PICA. Posterior inferior cerebral arteries themselves are patent proximally. Basilar artery diminutive and mildly irregular but patent to its distal aspect without high-grade stenosis. Superior cerebral arteries grossly patent. Fetal type right PCA supplied via a new right posterior communicating artery. Left PCA supplied via the basilar artery. Extensive atheromatous irregularity throughout the P2 segments with multifocal moderate to severe stenoses. Additional atheromatous irregularity within the distal PCA  branches. Right PCA is not well seen distally, and is attenuated as compared to the left. Venous sinuses: Patent. Venous collateralization into the posterior aspect noted from the proximal right internal jugular vein. Anatomic variants: No significant anatomic variant. No aneurysm or vascular malformation. Delayed phase: No pathologic enhancement. Review of the MIP images confirms the above findings IMPRESSION: CTA NECK IMPRESSION: 1. Severe near occlusive stenosis involving the proximal left ICA of greater than 90%. Stenosis begins at the bifurcation, and measures approximately 21 mm in length. 2. Multifocal atheromatous stenoses involving the proximal right ICA of up to 50%. Area affected begins at the right carotid bifurcation, and measures approximately 16 mm in length. 3. Multifocal severe proximal right vertebral artery stenoses as above, with more mild to moderate narrowing throughout the left vertebral artery. Left vertebral artery is dominant. 4. 50% stenosis at the proximal right brachiocephalic artery. No other  high-grade stenosis about the origin of the great vessels. 5. Layering bilateral pleural effusions with associated atelectasis. Possible superimposed infiltrate within the superior segment of the right lower lobe. CTA HEAD IMPRESSION: 1. 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 2. Extensive atheromatous disease throughout the posterior circulation as above, most notable within the bilateral PCAs. Dominant left vertebral artery and basilar artery are patent but irregular with multifocal stenoses as above. 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 Electronically Signed   By: Rise Mu M.D.   On: 03/03/2016 18:59   Dg Chest Port 1v Same Day  Result Date: 03/02/2016 CLINICAL DATA:  Shortness of breath. EXAM: PORTABLE CHEST 1 VIEW COMPARISON:  None. FINDINGS: The heart size and  mediastinal contours are within normal limits. Atherosclerosis of thoracic aorta is noted. No pneumothorax is noted. Left lung is clear. Right infrahilar opacity is noted concerning for pneumonia. No significant pleural effusion is noted. The visualized skeletal structures are unremarkable. IMPRESSION: Right infrahilar opacity concerning for pneumonia or edema. Aortic atherosclerosis. Followup PA and lateral chest X-ray is recommended in 3-4 weeks following trial of antibiotic therapy to ensure resolution and exclude underlying malignancy. Electronically Signed   By: Lupita Raider, M.D.   On: 03/02/2016 08:09   Dg Swallowing Func-speech Pathology  Result Date: 03/03/2016 Objective Swallowing Evaluation: Type of Study: MBS-Modified Barium Swallow Study Patient Details Name: Shamonica Schadt MRN: [id number] Date of Birth: 1927-03-07 Today's Date: 03/03/2016 Time: SLP Start Time (ACUTE ONLY): 1348-SLP Stop Time (ACUTE ONLY): 1358 SLP Time Calculation (min) (ACUTE ONLY): 10 min Past Medical History: Past Medical History: Diagnosis Date . Anxiety  . Depression  . Diabetes (HCC)  . Headache 06/09/2015 . High cholesterol  . Hypertension  . Macular degeneration  . Migraine  . Syncope and collapse 06/09/2015 Past Surgical History: Past Surgical History: Procedure Laterality Date . ABDOMINAL HYSTERECTOMY   . APPENDECTOMY   . CAROTID ARTERY ANGIOPLASTY Right  . CATARACT EXTRACTION Bilateral  . LUMBAR LAMINECTOMY   HPI: 83 y.o.femalewith medical history significant for insulin-dependent diabetes mellitus, anxiety, depression, and hypertension who presented to [facility] emergency department on 02/27/2016 for evaluation of several days of generalized weakness, altered mental status, and hypotension. She was treated for UTI without improved mentation. MRI 1/29 showed acute infarct in the right frontoparietal junction. CXR 1/30 showed new opacity in the RLL consistent with PNA or aspiration. Pt has been treated for oral  candidiasis and PNA. Daughter reports coughing and difficulty swallowing solids with odnyophagia.  Subjective: pt alert, delayed processing Assessment / Plan / Recommendation CHL IP CLINICAL IMPRESSIONS 03/03/2016 Therapy Diagnosis Mild oral phase dysphagia Clinical Impression Pt has a mild oral dysphagia but with no aspiration or penetration observed. View of oral phase was limited by dental hardware, but from what could be seen, there was oral residue left behind by pureed and soft solids.  Pt did a spontaneous second swallow to reduce residue. She had difficulty propeling a pill posterior with thin liquids, and needed multiple liquid washes in an attempt to clear. Of note, there was no coughing observed during MBS as there has been at bedside. Recommend that pt remain on current Dys 2 diet and thin liquids. SLP to f/u for solid advancement as appropriate. Impact on safety and function Mild aspiration risk   CHL IP TREATMENT RECOMMENDATION 03/03/2016 Treatment Recommendations Therapy as outlined in treatment plan below   Prognosis 03/03/2016 Prognosis for Safe Diet  Advancement Good Barriers to Reach Goals Cognitive deficits Barriers/Prognosis Comment -- CHL IP DIET RECOMMENDATION 03/03/2016 SLP Diet Recommendations Dysphagia 2 (Fine chop) solids;Thin liquid Liquid Administration via Cup;Straw Medication Administration Whole meds with puree Compensations Slow rate;Minimize environmental distractions;Small sips/bites;Follow solids with liquid Postural Changes Seated upright at 90 degrees   CHL IP OTHER RECOMMENDATIONS 03/03/2016 Recommended Consults -- Oral Care Recommendations Oral care BID Other Recommendations --   CHL IP FOLLOW UP RECOMMENDATIONS 03/03/2016 Follow up Recommendations Inpatient Rehab   CHL IP FREQUENCY AND DURATION 03/03/2016 Speech Therapy Frequency (ACUTE ONLY) min 2x/week Treatment Duration 2 weeks      CHL IP ORAL PHASE 03/03/2016 Oral Phase Impaired Oral - Pudding Teaspoon -- Oral - Pudding Cup -- Oral -  Honey Teaspoon -- Oral - Honey Cup -- Oral - Nectar Teaspoon -- Oral - Nectar Cup -- Oral - Nectar Straw -- Oral - Thin Teaspoon -- Oral - Thin Cup WFL Oral - Thin Straw WFL Oral - Puree Lingual/palatal residue;Delayed oral transit;Reduced posterior propulsion Oral - Mech Soft Lingual/palatal residue;Delayed oral transit;Reduced posterior propulsion Oral - Regular -- Oral - Multi-Consistency -- Oral - Pill Delayed oral transit;Reduced posterior propulsion Oral Phase - Comment --  CHL IP PHARYNGEAL PHASE 03/03/2016 Pharyngeal Phase WFL Pharyngeal- Pudding Teaspoon -- Pharyngeal -- Pharyngeal- Pudding Cup -- Pharyngeal -- Pharyngeal- Honey Teaspoon -- Pharyngeal -- Pharyngeal- Honey Cup -- Pharyngeal -- Pharyngeal- Nectar Teaspoon -- Pharyngeal -- Pharyngeal- Nectar Cup -- Pharyngeal -- Pharyngeal- Nectar Straw -- Pharyngeal -- Pharyngeal- Thin Teaspoon -- Pharyngeal -- Pharyngeal- Thin Cup -- Pharyngeal -- Pharyngeal- Thin Straw -- Pharyngeal -- Pharyngeal- Puree -- Pharyngeal -- Pharyngeal- Mechanical Soft -- Pharyngeal -- Pharyngeal- Regular -- Pharyngeal -- Pharyngeal- Multi-consistency -- Pharyngeal -- Pharyngeal- Pill -- Pharyngeal -- Pharyngeal Comment --  CHL IP CERVICAL ESOPHAGEAL PHASE 03/03/2016 Cervical Esophageal Phase WFL Pudding Teaspoon -- Pudding Cup -- Honey Teaspoon -- Honey Cup -- Nectar Teaspoon -- Nectar Cup -- Nectar Straw -- Thin Teaspoon -- Thin Cup -- Thin Straw -- Puree -- Mechanical Soft -- Regular -- Multi-consistency -- Pill -- Cervical Esophageal Comment -- No flowsheet data found. Maxcine Hamaiewonsky, Laura 03/03/2016, 2:26 PM  Maxcine HamLaura Paiewonsky, M.A. CCC-SLP [phone]             Anti-infectives: Anti-infectives    Start     Dose/Rate Route Frequency Ordered Stop   03/02/16 2300  fluconazole (DIFLUCAN) IVPB 100 mg     100 mg 50 mL/hr over 60 Minutes Intravenous Every 24 hours 03/02/16 0003     03/02/16 1200  vancomycin (VANCOCIN) IVPB 750 mg/150 ml premix     750 mg 150 mL/hr over 60  Minutes Intravenous Every 12 hours 03/02/16 0005     03/02/16 0600  piperacillin-tazobactam (ZOSYN) IVPB 3.375 g     3.375 g 12.5 mL/hr over 240 Minutes Intravenous Every 8 hours 03/02/16 0005     03/02/16 0015  fluconazole (DIFLUCAN) IVPB 400 mg     400 mg 100 mL/hr over 120 Minutes Intravenous  Once 03/01/16 2338 03/02/16 0242   03/02/16 0015  vancomycin (VANCOCIN) IVPB 750 mg/150 ml premix     750 mg 150 mL/hr over 60 Minutes Intravenous  Once 03/02/16 0005 03/02/16 0140      Assessment/Plan: s/p * No surgery found * Reviewed her CT angiogram and discussed with the patient and her family present. This does show irregularity in her right common carotid artery. 50% stenosis with some tortuosity. High-grade stenosis in the left carotid. Plain no current indication  for surgery. Will have patient follow-up with Dr. Randie Heinz as an outpatient after recovery from current event to determine if any treatment will be recommended depending on her recovery.   LOS: 3 days   Talayah Picardi 03/04/2016, 10:09 AM

## 2016-03-04 NOTE — Progress Notes (Signed)
STROKE TEAM PROGRESS NOTE   HISTORY OF PRESENT ILLNESS (per record) Valerie Arnold is an 83 y.o. female who [facility] presented at [facility] ED on 1/28 with a several day history of generalized weakness, AMS and hypotension. Her troponin was markedly elevated. Cardiology consult recommended anticoagulation with Lovenox. Her troponin trended back down and she was started on ASA and a statin. He AMS did not improve with rx of her UTI. An MRI brain on 1/29 revealed a 3-4 mm acute infarction in the right frontoparietal junction. Plan was to continue ASA and then change to Plavix. Carotid ultrasound showed high grade left ICA origin stenosis with near occlusion. Vascular surgery felt that a waiting period of 2-3 weeks would be needed prior to CEA given acute MRI and acute CVA.   The patient has had problems with swallowing and has been choking, per daughter. She was treated with Zosyn for suspected aspiration pneumonia as well as her UTI.   Echocardiogram revealed akinesis of the basal inferior wall with overall normal LV systolic function; grade 1 diastolic dysfunction with elevated LV filling pressure; moderate LVH; calcified aortic valve with no AS by doppler; moderate, posterior directed MR; mild TR.  Patient was not administered IV t-PA secondary to . She was admitted for further evaluation and treatment.   SUBJECTIVE (INTERVAL HISTORY) Family at bedside.  Patient was without complaints today   OBJECTIVE Temp:  [97.8 F (36.6 C)-98.8 F (37.1 C)] 97.8 F (36.6 C) (02/03 1307) Pulse Rate:  [61-69] 62 (02/03 1307) Cardiac Rhythm: Normal sinus rhythm;Heart block;Bundle branch block (02/03 0715) Resp:  [16-20] 16 (02/03 1307) BP: (136-171)/(52-66) 136/52 (02/03 1307) SpO2:  [93 %-99 %] 99 % (02/03 1307)  CBC:   Recent Labs Lab 03/02/16 0332 03/03/16 0327 03/04/16 0535  WBC 8.0 5.6 6.6  NEUTROABS 5.9  --   --   HGB 11.1* 10.3* 11.1*  HCT 33.8* 32.0* 34.1*  MCV 93.4 93.8  93.9  PLT 116* 120* 139*    Basic Metabolic Panel:   Recent Labs Lab 03/02/16 0332 03/04/16 0303  NA 138 144  K 3.4* 3.3*  CL 102 106  CO2 25 27  GLUCOSE 245* 55*  BUN 11 7  CREATININE 0.97 1.00  CALCIUM 8.1* 8.7*    Lipid Panel:     Component Value Date/Time   CHOL 161 03/02/2016 0332   TRIG 172 (H) 03/02/2016 0332   HDL 27 (L) 03/02/2016 0332   CHOLHDL 6.0 03/02/2016 0332   VLDL 34 03/02/2016 0332   LDLCALC 100 (H) 03/02/2016 0332   HgbA1c:  Lab Results  Component Value Date   HGBA1C 6.6 (H) 03/02/2016   Urine Drug Screen: No results found for: LABOPIA, COCAINSCRNUR, LABBENZ, AMPHETMU, THCU, LABBARB    IMAGING I have personally reviewed the radiological images below and agree with the radiology interpretations.  CT angiogram head and neck Pending  03/03/2016  CTA NECK  1. Severe near occlusive stenosis involving the proximal left ICA of greater than 90%.   Stenosis begins at the bifurcation, and measures approximately 21 mm in length. 2. Multifocal atheromatous stenoses involving the proximal right ICA of up to 50%. Area affected begins at the right carotid  bifurcation, and measures approximately 16 mm in length. 3. Multifocal severe proximal right vertebral artery stenoses as above, with more mild to moderate narrowing throughout the left vertebral artery. Left vertebral artery is dominant. 4. 50% stenosis at the proximal right brachiocephalic artery. No other high-grade stenosis about the origin of  the great vessels. 5. Layering bilateral pleural effusions with associated atelectasis.   Possible superimposed infiltrate within the superior segment of the right lower lobe.   CTA HEAD   1. 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 2. Extensive atheromatous disease throughout the posterior circulation as above, most notable within the bilateral PCAs. Dominant left  vertebral artery and basilar artery are patent but irregular with multifocal stenoses as above. 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  2-D echocardiogram - Left ventricle: The cavity size was normal. Wall thickness was increased in a pattern of moderate LVH. Systolic function was normal. The estimated ejection fraction was in the range of 55% to 60%. There is akinesis of the basalinferior myocardium. Doppler parameters are consistent with abnormal left ventricular relaxation (grade 1 diastolic dysfunction). Doppler parameters are consistent with high ventricular filling pressure. - Mitral valve: There was moderate regurgitation. - Pulmonary arteries: PA peak pressure: 34 mm Hg (S). - Pericardium, extracardiac: A trivial pericardial effusion was identified. Impressions: - Akinesis of the basal inferior wall with overall normal LV  systolic function; grade 1 diastolic dysfunction with elevated LV filling pressure; moderate LVH; calcified aortic valve with no AS by doppler; moderate, posterior directed MR; mild TR.  Carotid Doppler upper range 40-59% versus low range 60-79% right internal carotid artery stenosis and 80-99% left internal carotid artery stenosis. Vertebral arteries are patent with antegrade flow.  PHYSICAL EXAM General Examination:                                                                                                      HEENT-  Normocephalic/atraumatic.   Lungs- No gross wheezing. Respirations unlabored.  Extremities- Warm and well perfused.   Neurological Examination Mental Status: Drowsy. Oriented to self. States that she is at home. Not oriented to city or situation.  Intact comprehension of commands. No expressive aphasia noted.  Cranial Nerves: II: Visual fields grossly intact to bedside confrontation. PERRL. III,IV, VI: ptosis not present, EOMI without nystagmus V,VII: smile symmetric, facial temp sensation normal bilaterally VIII: hearing intact  to questions and commands XI: Symmetric XII: Non-cooperative Motor: Exam limited by lack of cooperation. Strength assessed by observing spontaneous movements and with light noxious stimuli.  Right :  Upper extremity   4-5/5                       Left:     Upper extremity   4-5/5             Lower extremity   4-5/5                                   Lower extremity   4-5/5 No asymmetry noted.  Sensory: Reacts to FT in all 4 extremities.  Deep Tendon Reflexes: 0 patellae and achilles. 1+ upper extremities.  Plantars: Equivocal.  Cerebellar: Slow FNF bilaterally with questionable mild ataxia on the left.  Gait: Deferred  ASSESSMENT/PLAN Valerie Arnold is a 83 y.o. female with history of diabetes, hypertension, chronic kidney disease stage II and hyperlipidemia with no prior coronary artery disease. He was transferred from [facility] with ischemic stroke.  New A. fib on admission to [facility] and NSTEMI at [facility].   Stroke:  right frontoparietal subcortical punctate infarct embolic likely secondary to new diagnosis atrial fibrillation   CTA Neck - Left ICA greater than 90%. Multifocal severe R VA stenoses. Possible RLL infiltrate.  CTA Head - Severe proximal left M1 and left A2 stenosis. Extensive atheromatous disease posterior circulation.   Carotid Doppler  R ICA 40-59% to 60-79% stenosis, L ICA 80-99% stenosis  2D Echo  EF 55-60%. No obvious source of embolus  LDL 100  HgbA1c 6.6  IV heparin for VTE prophylaxis DIET DYS 2 Room service appropriate? Yes; Fluid consistency: Thin  No antithrombotic prior to admission, now on Eliquis.  Ongoing aggressive stroke risk factor management  Therapy recommendations:  CIR. Consult requested.  Disposition:  pending   Patient followed with Dr. Anne Hahn in [facility] in the past  Atrial Fibrillation w/ RVR  New diagnosis this admission  Spontaneously converted to sinus rhythm with metoprolol   CHA2DS2-VASc Score =  8  Anticoagulation recommended, -> now on eliquis for stroke prevention.  Carotid stenosis   Carotid Doppler  R ICA 40-59% to 60-79% stenosis, L ICA 80-99%  Left ICA stenosis likely to be asymptomatic  CTA head and neck - as above.  Followed by the [facility]  Surgery planned in 2-3 weeks  Continue aspirin  NSTEMI  Elevated troponin at [facility] prior to transfer here  Cardiology on board  On heparin IV and aspirin  Hypertension  Stable  Permissive hypertension (OK if < 180/105) but gradually normalize in 5-7 days  Long-term BP goal normotensive  Hyperlipidemia  Home meds:  No statin  LDL 100, goal < 70  Now on Lipitor 40 mg daily  Continue statin at discharge  Diabetes type II  HgbA1c 6.6, goal < 7.0  Controlled  On Lantus  SSI  Other Stroke Risk Factors  Advanced age  Migraines  Other Active Problems  Chronic kidney disease stage II  Probable aspiration pneumonia -> Zosyn / vancomycin started 12/30/2016  Dysphagia secondary to stroke  Oral candidiasis  Hypothyroidism  Generalized weakness/physical deconditioning.  Hypokalemia - supplement and recheck in AM.  Hospital day # 3  To contact Stroke Continuity provider, please refer to [email or web address]. After hours, contact General Neurology

## 2016-03-04 NOTE — Progress Notes (Signed)
Patient's sugar low at 29. Patient is asymptomatic at this time. Patient was given 8 ounces of orange drink with sugar added. Will recheck sugar.

## 2016-03-04 NOTE — Progress Notes (Signed)
PROGRESS NOTE        PATIENT DETAILS Name: Valerie Arnold Age: 83 y.o. Sex: female Date of Birth: 20-Aug-1927 Admit Date: 03/01/2016 Admitting Physician Briscoe Deutscher, MD [id number], Youlanda Mighty, MD  Brief Narrative: Patient is a 83 y.o. female who was admitted to [facility] on 1/28 for acute encephalopathy with NSTEMI. Subsequently found to have acute CVA on the right hemisphere - hospital course was complicated by UTI,aspiration PNA. Carotid doppler at [facility] showed high grade left carotid stenosis.  Transferred to [facility] for neurology and cardiology consult. Cardiology, Neurology, Vascular surgery consulted. - see below for details.   Subjective:  Patient in bed, feels weak, no headache or chest pain, no cough or shortness of breath.   Assessment/Plan: NSTEMI (non-ST elevated myocardial infarction): No current anginal symptoms. Cardiology at [facility] planned to pursue conservative treatment. 2D echocardiogram showed EF 55-60% (done at [facility]). Troponin continues to trend down. Continue aspirin, Ranexa, statin, beta blocker. Seen by cardiology on 2/1-Dr. Delton See recommends that we continue with conservative management, with outpatient cardiology follow-up with Dr. Delton See post discharge.  Newly diagnosed likely paroxysmal Atrial Fibrillation RVR: Occurred on night of 03/01/16 - back in sinus rhythm. Started on metoprolol and Eliquis per pharmacy. CHADS2VASC of 8. Cardiology following.  Acute CVA: Bilateral carotid artery disease, A. fib. Echocardiogram showed preserved EF. A1c was 6.6, LDL 105. Currently on ASA, lipitor 40 mg and Eliquis. Vascular surgery following, they will consider surgery in the outpatient setting once patient is more conditioned. We'll likely require SNF on discharge.  High - grade, left OCA stenosis with moderate right ICA stenosis: Bilateral carotid artery disease left 90% right close to 50%. Vascular surgery  following, CT angiogram done and reviewed by vascular surgery. Vascular surgery contemplating intervention once patient has functionally recovered.  Probable Aspiration PNA: She is afebrile, although she is deconditioned and weak and she does not appear toxic. Evaluated by speech therapy,started on a dysphagia 2 diet. Spoke with family on 2/1, they're aware of aspiration risks. We'll stop vancomycin today, continue with Zosyn for a few more days.   Dysphagia: Probably due to CVA, generalized weakness. Evaluated by speech therapy on 2/1, subsequently started on a dysphagia 2 diet. Speech therapy following, planning on a modified barium swallow today.  Dyslipidemia: Continue Lipitor 40 mg. Repeat LDL in 3 mo.   Hypertension: Controlled - continue metoprolol.   Oral Candidiasis ([place]): Continue nystatin and fluconazole.    Type II diabetes mellitus: HgbA1C 6.6 on 03/02/16.CBGs stable, continue 30 units of Lantus daily at bedtime and SSI.  Hypothyroidism: Continue levothyroxine.   Generalized weakness/physical deconditioning: Secondary to acute illness-suspect will require SNF on discharge. Family agreeable.  DVT Prophylaxis: Prophylactic Heparin  Code Status: DNR  Family Communication: Spoke with daughter bedside on 03/04/2016  Disposition Plan: SNF   Antimicrobial agents: Anti-infectives    Start     Dose/Rate Route Frequency Ordered Stop   03/02/16 2300  fluconazole (DIFLUCAN) IVPB 100 mg     100 mg 50 mL/hr over 60 Minutes Intravenous Every 24 hours 03/02/16 0003     03/02/16 1200  vancomycin (VANCOCIN) IVPB 750 mg/150 ml premix     750 mg 150 mL/hr over 60 Minutes Intravenous Every 12 hours 03/02/16 0005     03/02/16 0600  piperacillin-tazobactam (ZOSYN) IVPB 3.375 g     3.375 g  12.5 mL/hr over 240 Minutes Intravenous Every 8 hours 03/02/16 0005     03/02/16 0015  fluconazole (DIFLUCAN) IVPB 400 mg     400 mg 100 mL/hr over 120 Minutes Intravenous  Once 03/01/16 2338  03/02/16 0242   03/02/16 0015  vancomycin (VANCOCIN) IVPB 750 mg/150 ml premix     750 mg 150 mL/hr over 60 Minutes Intravenous  Once 03/02/16 0005 03/02/16 0140      Procedures: None at this time  CONSULTS: Cardiology Vascular Surgery, Dr. Randie Heinz Neurology  Time spent: 25 minutes-Greater than 50% of this time was spent in counseling, explanation of diagnosis, planning of further management, and coordination of care.  MEDICATIONS: Scheduled Meds: . apixaban  5 mg Oral BID  . aspirin EC  81 mg Oral Daily  . atorvastatin  40 mg Oral q1800  . cholecalciferol  5,000 Units Oral Daily  . fluconazole (DIFLUCAN) IV  100 mg Intravenous Q24H  . gabapentin  300 mg Oral BID  . insulin aspart  0-9 Units Subcutaneous Q4H  . insulin glargine  18 Units Subcutaneous QHS  . levothyroxine  50 mcg Oral QAC breakfast  . metoprolol tartrate  12.5 mg Oral BID  . nystatin  5 mL Mouth/Throat QID  . piperacillin-tazobactam (ZOSYN)  IV  3.375 g Intravenous Q8H  . potassium chloride  40 mEq Oral Q6H  . ranolazine  500 mg Oral BID  . tamsulosin  0.4 mg Oral Daily  . vancomycin  750 mg Intravenous Q12H   Continuous Infusions: . sodium chloride 75 mL/hr at 03/01/16 2345   PRN Meds:.acetaminophen **OR** acetaminophen (TYLENOL) oral liquid 160 mg/5 mL **OR** acetaminophen, meclizine, metoprolol, morphine injection, senna-docusate   PHYSICAL EXAM: Vital signs: Vitals:   03/03/16 2100 03/04/16 0108 03/04/16 0520 03/04/16 0907  BP: (!) 148/55 (!) 151/60 (!) 161/58 (!) 170/66  Pulse: 66 66 61 65  Resp:  20 20 16   Temp: 98.2 F (36.8 C) 98 F (36.7 C) 98.8 F (37.1 C) 98.5 F (36.9 C)  TempSrc: Oral Oral Oral Axillary  SpO2: 96% 93% 98% 95%  Weight:      Height:       Filed Weights   03/01/16 2143  Weight: 76.5 kg (168 lb 9.6 oz)   Body mass index is 28.94 kg/m.   General appearance: Awake, alert, not in any distress. Speech Clear. Not toxic Looking Eyes: PERRLA,no scleral icterus.Pink  conjunctiva HEENT: Atraumatic and Normocephalic Neck: Supple, no JVD. No cervical lymphadenopathy. No thyromegaly Resp:Good air entry bilaterally, no added sounds  CVS: S1 S2 regular, no murmurs.  GI: Bowel sounds present, Non tender and not distended with no gaurding, rigidity or rebound.No organomegaly Extremities: B/L Lower Ext shows no edema, both legs are warm to touch Neurology:  Speech clear,Non focal, sensation is grossly intact. Psychiatric: Normal judgment and insight. AAO x 3. Normal mood. Musculoskeletal: No digital cyanosis Skin: No Rash, warm and dry Wounds: N/A  I have personally reviewed following labs and imaging studies  LABORATORY DATA: CBC:  Recent Labs Lab 03/02/16 0332 03/03/16 0327 03/04/16 0535  WBC 8.0 5.6 6.6  NEUTROABS 5.9  --   --   HGB 11.1* 10.3* 11.1*  HCT 33.8* 32.0* 34.1*  MCV 93.4 93.8 93.9  PLT 116* 120* 139*    Basic Metabolic Panel:  Recent Labs Lab 03/02/16 0332 03/04/16 0303  NA 138 144  K 3.4* 3.3*  CL 102 106  CO2 25 27  GLUCOSE 245* 55*  BUN 11 7  CREATININE  0.97 1.00  CALCIUM 8.1* 8.7*    GFR: Estimated Creatinine Clearance: 40.4 mL/min (by C-G formula based on SCr of 1 mg/dL).  Liver Function Tests:  Recent Labs Lab 03/02/16 0332  AST 35  ALT 18  ALKPHOS 39  BILITOT 0.9  PROT 5.3*  ALBUMIN 2.6*   No results for input(s): LIPASE, AMYLASE in the last 168 hours. No results for input(s): AMMONIA in the last 168 hours.  Coagulation Profile: No results for input(s): INR, PROTIME in the last 168 hours.  Cardiac Enzymes:  Recent Labs Lab 03/02/16 0332 03/02/16 1014 03/02/16 1627  TROPONINI 1.52* 2.46* 2.38*    BNP (last 3 results) No results for input(s): PROBNP in the last 8760 hours.  HbA1C:  Recent Labs  03/02/16 0332  HGBA1C 6.6*    CBG:  Recent Labs Lab 03/04/16 0006 03/04/16 0346 03/04/16 0446 03/04/16 0803 03/04/16 0858  GLUCAP 134* 29* 79 44* 96    Lipid Profile:  Recent  Labs  03/02/16 0332  CHOL 161  HDL 27*  LDLCALC 100*  TRIG 172*  CHOLHDL 6.0    Thyroid Function Tests: No results for input(s): TSH, T4TOTAL, FREET4, T3FREE, THYROIDAB in the last 72 hours.  Anemia Panel: No results for input(s): VITAMINB12, FOLATE, FERRITIN, TIBC, IRON, RETICCTPCT in the last 72 hours.  Urine analysis: No results found for: COLORURINE, APPEARANCEUR, LABSPEC, PHURINE, GLUCOSEU, HGBUR, BILIRUBINUR, KETONESUR, PROTEINUR, UROBILINOGEN, NITRITE, LEUKOCYTESUR  Sepsis Labs: Lactic Acid, Venous No results found for: LATICACIDVEN  MICROBIOLOGY: No results found for this or any previous visit (from the past 240 hour(s)).  RADIOLOGY STUDIES/RESULTS: Ct Angio Head W Or Wo Contrast  Result Date: 03/03/2016 CLINICAL DATA:  Initial evaluation for carotid artery stenosis. EXAM: CT ANGIOGRAPHY HEAD AND NECK TECHNIQUE: Multidetector CT imaging of the head and neck was performed using the standard protocol during bolus administration of intravenous contrast. Multiplanar CT image reconstructions and MIPs were obtained to evaluate the vascular anatomy. Carotid stenosis measurements (when applicable) are obtained utilizing NASCET criteria, using the distal internal carotid diameter as the denominator. CONTRAST:  50 cc of Isovue 370. COMPARISON:  None. FINDINGS: CT HEAD FINDINGS Brain: Atrophy with moderate chronic microvascular ischemic disease. Remote lacunar infarct present within the left caudate head. No acute intracranial hemorrhage. No evidence for acute large vessel territory infarct. Previous identified small infarct within the right frontal parietal region not seen. No mass lesion, midline shift or mass effect. Ventricular prominence related to global parenchymal volume loss without hydrocephalus. No extra-axial fluid collection. Vascular: No hyperdense vessel. Scattered vascular calcifications noted within the carotid siphons. Skull: Scalp soft tissues within normal limits.   Calvarium intact. Sinuses: Scattered mucosal thickening within the ethmoidal air cells, sphenoid sinuses, and maxillary sinuses. Trace right mastoid effusion. Orbits: Globes and orbital soft tissues within normal limits. Patient is status post lens extraction bilaterally. CTA NECK FINDINGS Aortic arch: Extensive atheromatous irregularity throughout the aortic arch. Arch itself of normal caliber with normal 3 vessel morphology. Atheromatous plaque about the origin of the great vessels. There is moderate narrowing of approximately 50% at the proximal right brachiocephalic artery (series 11, image 3 weight). No other significant stenosis at the origin of the great vessels. Extensive plaque throughout the visualized subclavian arteries without significant stenosis. Right carotid system: Scattered eccentric calcified plaque throughout the right common carotid artery without flow-limiting stenosis. Multifocal atheromatous stenoses at the proximal right subclavian artery measuring up to 50% by NASCET criteria area of involved extends from the carotid bifurcation and measures approximately  16 mm in length distally, right ICA tortuous but patent to the skullbase without additional stenosis or other vascular abnormality. Left carotid system: Scattered eccentric plaque within the left common carotid artery without flow-limiting stenosis. Calcified and noncalcified plaque at the left carotid bifurcation/proximal left ICA with associated severe near occlusive high-grade stenosis (greater than 90%). A string sign is present (series 12, image 117). Area involvement extends from the carotid bifurcation in measures approximately 21 mm in length. Distally, left ICA tortuous but widely patent to the skullbase without additional stenosis or other acute vascular abnormality. No made of moderate to severe narrowing at the origin of the left external carotid artery is well. Vertebral arteries: Both of the vertebral arteries arise from the  subclavian arteries. Severe stenosis at the origin of the right vertebral artery (series 11, image 261). More moderate narrowing at the origin of the left vertebral artery. Left vertebral artery is dominant. There are additional multifocal moderate to severe stenoses within the right V1/V2 segments (series 11, image 228, 214). Additional atheromatous irregularity within the hypoplastic right vertebral artery distally without severe stenosis. Multifocal plaque present throughout the left V2 and V3 segments with mild to moderate multifocal narrowing. No high-grade flow-limiting stenosis. Skeleton: No acute osseous abnormality. No worrisome lytic or blastic osseous lesions. Moderate multilevel degenerative spondylolysis noted within the cervical spine. Prominent broad posterior disc protrusion noted at C4-5 with resultant and least moderate stenosis. Other neck: Visualized soft tissues of the neck demonstrate no acute abnormality. No adenopathy. Thyroid within normal limits. Upper chest: Few mildly prominent pretracheal lymph nodes measure at the upper limits of normal at 1 cm in short axis. Mild esophageal wall thickening, which may be related to reflux disease or possibly acute esophagitis. Small layering bilateral pleural effusions with associated atelectatic changes. Patchy opacity within the superior segment right lower lobe may reflect infiltrate. Review of the MIP images confirms the above findings CTA HEAD FINDINGS Anterior circulation: Petrous segments widely patent bilaterally. Scattered atheromatous plaque throughout the carotid siphons with mild to moderate diffuse narrowing. ICA termini widely patent. Left A1 segment widely patent. Right A1 segment hypoplastic and/ or absent, likely accounting for the slightly diminutive right ICA is compared to the left. Anterior communicating artery normal. Short-segment severe left A2 stenosis noted (series 11, image 8). ACA is otherwise patent to their distal aspects.  For Short-segment severe proximal left M1 stenosis, extending from the terminus and measuring 5 mm in length (series 11, image 96). Left M1 segment otherwise widely patent. Small vessel atheromatous irregularity throughout the left MCA branches. Right M1 segment irregular with moderate diffuse narrowing distally. Several proximal moderate to severe right M2 stenoses noted. Small vessel irregularity throughout the right MCA branches, which are well perfused and fairly symmetric with the left. Posterior circulation: Scattered multifocal plaque within the dominant left vertebral artery as it crosses the dural margin. Associated mild to moderate multifocal narrowing. Hypoplastic right vertebral artery irregular and terminates in PICA. Posterior inferior cerebral arteries themselves are patent proximally. Basilar artery diminutive and mildly irregular but patent to its distal aspect without high-grade stenosis. Superior cerebral arteries grossly patent. Fetal type right PCA supplied via a new right posterior communicating artery. Left PCA supplied via the basilar artery. Extensive atheromatous irregularity throughout the P2 segments with multifocal moderate to severe stenoses. Additional atheromatous irregularity within the distal PCA branches. Right PCA is not well seen distally, and is attenuated as compared to the left. Venous sinuses: Patent. Venous collateralization into the posterior aspect noted  from the proximal right internal jugular vein. Anatomic variants: No significant anatomic variant. No aneurysm or vascular malformation. Delayed phase: No pathologic enhancement. Review of the MIP images confirms the above findings IMPRESSION: CTA NECK IMPRESSION: 1. Severe near occlusive stenosis involving the proximal left ICA of greater than 90%. Stenosis begins at the bifurcation, and measures approximately 21 mm in length. 2. Multifocal atheromatous stenoses involving the proximal right ICA of up to 50%. Area affected  begins at the right carotid bifurcation, and measures approximately 16 mm in length. 3. Multifocal severe proximal right vertebral artery stenoses as above, with more mild to moderate narrowing throughout the left vertebral artery. Left vertebral artery is dominant. 4. 50% stenosis at the proximal right brachiocephalic artery. No other high-grade stenosis about the origin of the great vessels. 5. Layering bilateral pleural effusions with associated atelectasis. Possible superimposed infiltrate within the superior segment of the right lower lobe. CTA HEAD IMPRESSION: 1. 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 2. Extensive atheromatous disease throughout the posterior circulation as above, most notable within the bilateral PCAs. Dominant left vertebral artery and basilar artery are patent but irregular with multifocal stenoses as above. 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 Electronically Signed   By: Rise MuBenjamin  McClintock M.D.   On: 03/03/2016 18:59   Ct Angio Neck W Or Wo Contrast  Result Date: 03/03/2016 CLINICAL DATA:  Initial evaluation for carotid artery stenosis. EXAM: CT ANGIOGRAPHY HEAD AND NECK TECHNIQUE: Multidetector CT imaging of the head and neck was performed using the standard protocol during bolus administration of intravenous contrast. Multiplanar CT image reconstructions and MIPs were obtained to evaluate the vascular anatomy. Carotid stenosis measurements (when applicable) are obtained utilizing NASCET criteria, using the distal internal carotid diameter as the denominator. CONTRAST:  50 cc of Isovue 370. COMPARISON:  None. FINDINGS: CT HEAD FINDINGS Brain: Atrophy with moderate chronic microvascular ischemic disease. Remote lacunar infarct present within the left caudate head. No acute intracranial hemorrhage. No evidence for acute large vessel territory infarct. Previous  identified small infarct within the right frontal parietal region not seen. No mass lesion, midline shift or mass effect. Ventricular prominence related to global parenchymal volume loss without hydrocephalus. No extra-axial fluid collection. Vascular: No hyperdense vessel. Scattered vascular calcifications noted within the carotid siphons. Skull: Scalp soft tissues within normal limits.  Calvarium intact. Sinuses: Scattered mucosal thickening within the ethmoidal air cells, sphenoid sinuses, and maxillary sinuses. Trace right mastoid effusion. Orbits: Globes and orbital soft tissues within normal limits. Patient is status post lens extraction bilaterally. CTA NECK FINDINGS Aortic arch: Extensive atheromatous irregularity throughout the aortic arch. Arch itself of normal caliber with normal 3 vessel morphology. Atheromatous plaque about the origin of the great vessels. There is moderate narrowing of approximately 50% at the proximal right brachiocephalic artery (series 11, image 3 weight). No other significant stenosis at the origin of the great vessels. Extensive plaque throughout the visualized subclavian arteries without significant stenosis. Right carotid system: Scattered eccentric calcified plaque throughout the right common carotid artery without flow-limiting stenosis. Multifocal atheromatous stenoses at the proximal right subclavian artery measuring up to 50% by NASCET criteria area of involved extends from the carotid bifurcation and measures approximately 16 mm in length distally, right ICA tortuous but patent to the skullbase without additional stenosis or other vascular abnormality. Left carotid system: Scattered eccentric plaque within the left common carotid artery without flow-limiting stenosis. Calcified and noncalcified plaque  at the left carotid bifurcation/proximal left ICA with associated severe near occlusive high-grade stenosis (greater than 90%). A string sign is present (series 12, image  117). Area involvement extends from the carotid bifurcation in measures approximately 21 mm in length. Distally, left ICA tortuous but widely patent to the skullbase without additional stenosis or other acute vascular abnormality. No made of moderate to severe narrowing at the origin of the left external carotid artery is well. Vertebral arteries: Both of the vertebral arteries arise from the subclavian arteries. Severe stenosis at the origin of the right vertebral artery (series 11, image 261). More moderate narrowing at the origin of the left vertebral artery. Left vertebral artery is dominant. There are additional multifocal moderate to severe stenoses within the right V1/V2 segments (series 11, image 228, 214). Additional atheromatous irregularity within the hypoplastic right vertebral artery distally without severe stenosis. Multifocal plaque present throughout the left V2 and V3 segments with mild to moderate multifocal narrowing. No high-grade flow-limiting stenosis. Skeleton: No acute osseous abnormality. No worrisome lytic or blastic osseous lesions. Moderate multilevel degenerative spondylolysis noted within the cervical spine. Prominent broad posterior disc protrusion noted at C4-5 with resultant and least moderate stenosis. Other neck: Visualized soft tissues of the neck demonstrate no acute abnormality. No adenopathy. Thyroid within normal limits. Upper chest: Few mildly prominent pretracheal lymph nodes measure at the upper limits of normal at 1 cm in short axis. Mild esophageal wall thickening, which may be related to reflux disease or possibly acute esophagitis. Small layering bilateral pleural effusions with associated atelectatic changes. Patchy opacity within the superior segment right lower lobe may reflect infiltrate. Review of the MIP images confirms the above findings CTA HEAD FINDINGS Anterior circulation: Petrous segments widely patent bilaterally. Scattered atheromatous plaque throughout  the carotid siphons with mild to moderate diffuse narrowing. ICA termini widely patent. Left A1 segment widely patent. Right A1 segment hypoplastic and/ or absent, likely accounting for the slightly diminutive right ICA is compared to the left. Anterior communicating artery normal. Short-segment severe left A2 stenosis noted (series 11, image 8). ACA is otherwise patent to their distal aspects. For Short-segment severe proximal left M1 stenosis, extending from the terminus and measuring 5 mm in length (series 11, image 96). Left M1 segment otherwise widely patent. Small vessel atheromatous irregularity throughout the left MCA branches. Right M1 segment irregular with moderate diffuse narrowing distally. Several proximal moderate to severe right M2 stenoses noted. Small vessel irregularity throughout the right MCA branches, which are well perfused and fairly symmetric with the left. Posterior circulation: Scattered multifocal plaque within the dominant left vertebral artery as it crosses the dural margin. Associated mild to moderate multifocal narrowing. Hypoplastic right vertebral artery irregular and terminates in PICA. Posterior inferior cerebral arteries themselves are patent proximally. Basilar artery diminutive and mildly irregular but patent to its distal aspect without high-grade stenosis. Superior cerebral arteries grossly patent. Fetal type right PCA supplied via a new right posterior communicating artery. Left PCA supplied via the basilar artery. Extensive atheromatous irregularity throughout the P2 segments with multifocal moderate to severe stenoses. Additional atheromatous irregularity within the distal PCA branches. Right PCA is not well seen distally, and is attenuated as compared to the left. Venous sinuses: Patent. Venous collateralization into the posterior aspect noted from the proximal right internal jugular vein. Anatomic variants: No significant anatomic variant. No aneurysm or vascular  malformation. Delayed phase: No pathologic enhancement. Review of the MIP images confirms the above findings IMPRESSION: CTA NECK IMPRESSION: 1. Severe  near occlusive stenosis involving the proximal left ICA of greater than 90%. Stenosis begins at the bifurcation, and measures approximately 21 mm in length. 2. Multifocal atheromatous stenoses involving the proximal right ICA of up to 50%. Area affected begins at the right carotid bifurcation, and measures approximately 16 mm in length. 3. Multifocal severe proximal right vertebral artery stenoses as above, with more mild to moderate narrowing throughout the left vertebral artery. Left vertebral artery is dominant. 4. 50% stenosis at the proximal right brachiocephalic artery. No other high-grade stenosis about the origin of the great vessels. 5. Layering bilateral pleural effusions with associated atelectasis. Possible superimposed infiltrate within the superior segment of the right lower lobe. CTA HEAD IMPRESSION: 1. 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 2. Extensive atheromatous disease throughout the posterior circulation as above, most notable within the bilateral PCAs. Dominant left vertebral artery and basilar artery are patent but irregular with multifocal stenoses as above. 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 Electronically Signed   By: Rise Mu M.D.   On: 03/03/2016 18:59   Dg Chest Port 1v Same Day  Result Date: 03/02/2016 CLINICAL DATA:  Shortness of breath. EXAM: PORTABLE CHEST 1 VIEW COMPARISON:  None. FINDINGS: The heart size and mediastinal contours are within normal limits. Atherosclerosis of thoracic aorta is noted. No pneumothorax is noted. Left lung is clear. Right infrahilar opacity is noted concerning for pneumonia. No significant pleural effusion is noted. The visualized skeletal structures are unremarkable.  IMPRESSION: Right infrahilar opacity concerning for pneumonia or edema. Aortic atherosclerosis. Followup PA and lateral chest X-ray is recommended in 3-4 weeks following trial of antibiotic therapy to ensure resolution and exclude underlying malignancy. Electronically Signed   By: Lupita Raider, M.D.   On: 03/02/2016 08:09   Dg Swallowing Func-speech Pathology  Result Date: 03/03/2016 Objective Swallowing Evaluation: Type of Study: MBS-Modified Barium Swallow Study Patient Details Name: Valerie Arnold MRN: [id number] Date of Birth: 10-11-1927 Today's Date: 03/03/2016 Time: SLP Start Time (ACUTE ONLY): 1348-SLP Stop Time (ACUTE ONLY): 1358 SLP Time Calculation (min) (ACUTE ONLY): 10 min Past Medical History: Past Medical History: Diagnosis Date . Anxiety  . Depression  . Diabetes (HCC)  . Headache 06/09/2015 . High cholesterol  . Hypertension  . Macular degeneration  . Migraine  . Syncope and collapse 06/09/2015 Past Surgical History: Past Surgical History: Procedure Laterality Date . ABDOMINAL HYSTERECTOMY   . APPENDECTOMY   . CAROTID ARTERY ANGIOPLASTY Right  . CATARACT EXTRACTION Bilateral  . LUMBAR LAMINECTOMY   HPI: 83 y.o.femalewith medical history significant for insulin-dependent diabetes mellitus, anxiety, depression, and hypertension who presented to [facility] emergency department on 02/27/2016 for evaluation of several days of generalized weakness, altered mental status, and hypotension. She was treated for UTI without improved mentation. MRI 1/29 showed acute infarct in the right frontoparietal junction. CXR 1/30 showed new opacity in the RLL consistent with PNA or aspiration. Pt has been treated for oral candidiasis and PNA. Daughter reports coughing and difficulty swallowing solids with odnyophagia.  Subjective: pt alert, delayed processing Assessment / Plan / Recommendation CHL IP CLINICAL IMPRESSIONS 03/03/2016 Therapy Diagnosis Mild oral phase dysphagia Clinical Impression Pt has a mild oral  dysphagia but with no aspiration or penetration observed. View of oral phase was limited by dental hardware, but from what could be seen, there was oral residue left behind by pureed and soft solids.  Pt did a spontaneous second swallow to  reduce residue. She had difficulty propeling a pill posterior with thin liquids, and needed multiple liquid washes in an attempt to clear. Of note, there was no coughing observed during MBS as there has been at bedside. Recommend that pt remain on current Dys 2 diet and thin liquids. SLP to f/u for solid advancement as appropriate. Impact on safety and function Mild aspiration risk   CHL IP TREATMENT RECOMMENDATION 03/03/2016 Treatment Recommendations Therapy as outlined in treatment plan below   Prognosis 03/03/2016 Prognosis for Safe Diet Advancement Good Barriers to Reach Goals Cognitive deficits Barriers/Prognosis Comment -- CHL IP DIET RECOMMENDATION 03/03/2016 SLP Diet Recommendations Dysphagia 2 (Fine chop) solids;Thin liquid Liquid Administration via Cup;Straw Medication Administration Whole meds with puree Compensations Slow rate;Minimize environmental distractions;Small sips/bites;Follow solids with liquid Postural Changes Seated upright at 90 degrees   CHL IP OTHER RECOMMENDATIONS 03/03/2016 Recommended Consults -- Oral Care Recommendations Oral care BID Other Recommendations --   CHL IP FOLLOW UP RECOMMENDATIONS 03/03/2016 Follow up Recommendations Inpatient Rehab   CHL IP FREQUENCY AND DURATION 03/03/2016 Speech Therapy Frequency (ACUTE ONLY) min 2x/week Treatment Duration 2 weeks      CHL IP ORAL PHASE 03/03/2016 Oral Phase Impaired Oral - Pudding Teaspoon -- Oral - Pudding Cup -- Oral - Honey Teaspoon -- Oral - Honey Cup -- Oral - Nectar Teaspoon -- Oral - Nectar Cup -- Oral - Nectar Straw -- Oral - Thin Teaspoon -- Oral - Thin Cup WFL Oral - Thin Straw WFL Oral - Puree Lingual/palatal residue;Delayed oral transit;Reduced posterior propulsion Oral - Mech Soft Lingual/palatal  residue;Delayed oral transit;Reduced posterior propulsion Oral - Regular -- Oral - Multi-Consistency -- Oral - Pill Delayed oral transit;Reduced posterior propulsion Oral Phase - Comment --  CHL IP PHARYNGEAL PHASE 03/03/2016 Pharyngeal Phase WFL Pharyngeal- Pudding Teaspoon -- Pharyngeal -- Pharyngeal- Pudding Cup -- Pharyngeal -- Pharyngeal- Honey Teaspoon -- Pharyngeal -- Pharyngeal- Honey Cup -- Pharyngeal -- Pharyngeal- Nectar Teaspoon -- Pharyngeal -- Pharyngeal- Nectar Cup -- Pharyngeal -- Pharyngeal- Nectar Straw -- Pharyngeal -- Pharyngeal- Thin Teaspoon -- Pharyngeal -- Pharyngeal- Thin Cup -- Pharyngeal -- Pharyngeal- Thin Straw -- Pharyngeal -- Pharyngeal- Puree -- Pharyngeal -- Pharyngeal- Mechanical Soft -- Pharyngeal -- Pharyngeal- Regular -- Pharyngeal -- Pharyngeal- Multi-consistency -- Pharyngeal -- Pharyngeal- Pill -- Pharyngeal -- Pharyngeal Comment --  CHL IP CERVICAL ESOPHAGEAL PHASE 03/03/2016 Cervical Esophageal Phase WFL Pudding Teaspoon -- Pudding Cup -- Honey Teaspoon -- Honey Cup -- Nectar Teaspoon -- Nectar Cup -- Nectar Straw -- Thin Teaspoon -- Thin Cup -- Thin Straw -- Puree -- Mechanical Soft -- Regular -- Multi-consistency -- Pill -- Cervical Esophageal Comment -- No flowsheet data found. Maxcine Ham 03/03/2016, 2:26 PM  Maxcine Ham, M.A. CCC-SLP [phone]               LOS: 3 days   Signature  Susa Raring K M.D on 03/04/2016 at 12:37 PM  Between 7am to 7pm - Pager - [phone], After 7pm go to [email or web address] - password [facility]  [facility]  - Office  [phone]

## 2016-03-04 NOTE — Progress Notes (Signed)
Verbal order irrigate bladder 500cc sterile water clamp wait 15-20 observe patient for urge to void.  Patient up to bedside chair await results at this time.

## 2016-03-04 NOTE — Progress Notes (Signed)
Outpatient follow-up recommendations noted by Dr. Delton SeeNelson. Patient has been started on Eliquis d/t high CHADSVASC score. No further cardiac recommendations at this time. Cardiology will sign-off. Call with questions - follow-up with Dr. Delton SeeNelson.  Chrystie NoseKenneth C. Kaitlynne Wenz, MD, [facility] HillFACC Attending Cardiologist [facility]

## 2016-03-04 NOTE — Progress Notes (Signed)
ANTICOAGULATION CONSULT NOTE - Follow Up Consult  Pharmacy Consult for apixaban Indication: atrial fibrillation  No Known Allergies  Patient Measurements: Height: 5\' 4"  (162.6 cm) Weight: 168 lb 9.6 oz (76.5 kg) IBW/kg (Calculated) : 54.7   Vital Signs: Temp: 98.8 F (37.1 C) (02/03 0520) Temp Source: Oral (02/03 0520) BP: 161/58 (02/03 0520) Pulse Rate: 61 (02/03 0520)  Labs:  Recent Labs  03/02/16 0332  03/02/16 1014 03/02/16 1627 03/02/16 2012 03/03/16 0327 03/04/16 0303 03/04/16 0535  HGB 11.1*  --   --   --   --  10.3*  --  11.1*  HCT 33.8*  --   --   --   --  32.0*  --  34.1*  PLT 116*  --   --   --   --  120*  --  139*  HEPARINUNFRC  --   < > 0.55  --  0.40 0.34 0.32  --   CREATININE 0.97  --   --   --   --   --  1.00  --   TROPONINI 1.52*  --  2.46* 2.38*  --   --   --   --   < > = values in this interval not displayed.  Estimated Creatinine Clearance: 40.4 mL/min (by C-G formula based on SCr of 1 mg/dL).   Assessment: 83 yo female with NSTEMI medically managed at OSH, then treated with ppx dose of LMWH. [facility], found to be in afib and heparin started. Was not given TPA and was not on antithrombotic therapy PTA. Pharmacy now consulted to transition from heparin to apixaban for stroke prevention.   CBC stable, no bleeding noted. SCr <1.5, age [age] yo, Weight >60 kg   Plan:  Discontinue heparin with first dose of apixaban. Initiate apixaban 5 mg PO BID - pharmacy to sign off, will monitor peripherally. Please re-consult if needed. Monitor for s/sx bleeding, daily CBC  Allena Katzaroline E Kortlynn Poust, Pharm.D. PGY1 Pharmacy Resident 2/3/20188:26 AM Pager [phone]

## 2016-03-05 ENCOUNTER — Inpatient Hospital Stay (HOSPITAL_COMMUNITY): Payer: Medicare Other

## 2016-03-05 LAB — CBC
HEMATOCRIT: 31.5 % — AB (ref 36.0–46.0)
HEMOGLOBIN: 10.3 g/dL — AB (ref 12.0–15.0)
MCH: 30.3 pg (ref 26.0–34.0)
MCHC: 32.7 g/dL (ref 30.0–36.0)
MCV: 92.6 fL (ref 78.0–100.0)
Platelets: 194 10*3/uL (ref 150–400)
RBC: 3.4 MIL/uL — AB (ref 3.87–5.11)
RDW: 13.3 % (ref 11.5–15.5)
WBC: 5.7 10*3/uL (ref 4.0–10.5)

## 2016-03-05 LAB — GLUCOSE, CAPILLARY
GLUCOSE-CAPILLARY: 80 mg/dL (ref 65–99)
GLUCOSE-CAPILLARY: 66 mg/dL (ref 65–99)
GLUCOSE-CAPILLARY: 169 mg/dL — AB (ref 65–99)
GLUCOSE-CAPILLARY: 178 mg/dL — AB (ref 65–99)
Glucose-Capillary: 242 mg/dL — ABNORMAL HIGH (ref 65–99)
Glucose-Capillary: 87 mg/dL (ref 65–99)

## 2016-03-05 LAB — BASIC METABOLIC PANEL
ANION GAP: 7 (ref 5–15)
BUN: <5 mg/dL — ABNORMAL LOW (ref 6–20)
CALCIUM: 8.5 mg/dL — AB (ref 8.9–10.3)
CO2: 28 mmol/L (ref 22–32)
Chloride: 106 mmol/L (ref 101–111)
Creatinine, Ser: 0.97 mg/dL (ref 0.44–1.00)
GFR calc Af Amer: 60 mL/min — ABNORMAL LOW (ref >60)
GFR calc non Af Amer: 51 mL/min — ABNORMAL LOW (ref >60)
GLUCOSE: 94 mg/dL (ref 65–99)
POTASSIUM: 4 mmol/L (ref 3.5–5.1)
Sodium: 141 mmol/L (ref 135–145)

## 2016-03-05 MED ORDER — INSULIN GLARGINE 100 UNIT/ML ~~LOC~~ SOLN
10.0000 [IU] | Freq: Every day | SUBCUTANEOUS | Status: DC
  Administered 2016-03-05: 10 [IU] via SUBCUTANEOUS
  Filled 2016-03-05: qty 0.1

## 2016-03-05 MED ORDER — POTASSIUM CHLORIDE CRYS ER 20 MEQ PO TBCR
20.0000 meq | EXTENDED_RELEASE_TABLET | Freq: Two times a day (BID) | ORAL | Status: DC
Start: 2016-03-05 — End: 2016-03-06
  Administered 2016-03-05 – 2016-03-06 (×3): 20 meq via ORAL
  Filled 2016-03-05 (×3): qty 1

## 2016-03-05 NOTE — Progress Notes (Signed)
PROGRESS NOTE        PATIENT DETAILS Name: Valerie Arnold Age: 83 y.o. Sex: female Date of Birth: 1927-07-01 Admit Date: 03/01/2016 Admitting Physician Briscoe Deutscher, MD [id number], Youlanda Mighty, MD  Brief Narrative: Patient is a 83 y.o. female who was admitted to [facility] on 1/28 for acute encephalopathy with NSTEMI. Subsequently found to have acute CVA on the right hemisphere - hospital course was complicated by UTI,aspiration PNA. Carotid doppler at [facility] showed high grade left carotid stenosis.  Transferred to [facility] for neurology and cardiology consult. Cardiology, Neurology, Vascular surgery consulted. - see below for details.   Subjective:  Patient in bed, feels weak, no headache or chest pain, no cough or shortness of breath.   Assessment/Plan:  Acute CVA: Bilateral carotid artery disease, A. fib. Echocardiogram showed preserved EF. A1c was 6.6, LDL 105. Currently on ASA, lipitor 40 mg and Eliquis. Vascular surgery following, they will consider surgery in the outpatient setting once patient is more conditioned. We'll likely require SNF on discharge.  Newly diagnosed likely paroxysmal Atrial Fibrillation RVR: Occurred on night of 03/01/16 - back in sinus rhythm. Started on metoprolol and Eliquis per pharmacy. CHADS2VASC of 8. Cardiology following.  Dyslipidemia: Continue Lipitor 40 mg. Repeat LDL in 3 mo.   High - grade, left OCA stenosis with moderate right ICA stenosis: Bilateral carotid artery disease left 90% right close to 50%. Vascular surgery following, CT angiogram done and reviewed by vascular surgery. Vascular surgery contemplating intervention once patient has functionally recovered.  Aspiration PNA: Clinically resolved has finished antibiotic treatment.   Dysphagia: due to CVA, generalized weakness. Evaluated by speech therapy on 2/1, subsequently started on a dysphagia 2 diet. Speech therapy following..  NSTEMI (non-ST  elevated myocardial infarction): No current anginal symptoms. Cardiology at [facility] planned to pursue conservative treatment. 2D echocardiogram showed EF 55-60% (done at [facility]). Troponin continues to trend down. Continue aspirin, Ranexa, statin, beta blocker. Seen by cardiology on 2/1-Dr. Delton See recommends that we continue with conservative management, with outpatient cardiology follow-up with Dr. Delton See post discharge.  Hypertension: Controlled - continue metoprolol.  Urinary retention. On Flomax and Foley catheter, failed voiding trial with Foley removal.   Oral Candidiasis ([place]): Continue nystatin and fluconazole.    Hypothyroidism: Continue levothyroxine.   Generalized weakness/physical deconditioning: Secondary to acute illness-suspect will require SNF on discharge. Family agreeable.  DM2: HgbA1C 6.6 on 03/02/16.CBGs stable, on Lantus and sliding scale monitor. Lantus dose reduced due to poor oral intake.  Lab Results  Component Value Date   HGBA1C 6.6 (H) 03/02/2016   CBG (last 3)   Recent Labs  03/05/16 0448 03/05/16 0740 03/05/16 1119  GLUCAP 80 66 169*     DVT Prophylaxis: Prophylactic Heparin  Code Status: DNR  Family Communication: Spoke with daughter bedside on 03/04/2016, second daughter on 03/05/2016  Disposition Plan: SNF likely Monday  Antimicrobial agents: Anti-infectives    Start     Dose/Rate Route Frequency Ordered Stop   03/02/16 2300  fluconazole (DIFLUCAN) IVPB 100 mg     100 mg 50 mL/hr over 60 Minutes Intravenous Every 24 hours 03/02/16 0003     03/02/16 1200  vancomycin (VANCOCIN) IVPB 750 mg/150 ml premix  Status:  Discontinued     750 mg 150 mL/hr over 60 Minutes Intravenous Every 12 hours 03/02/16 0005 03/05/16 0749   03/02/16  0600  piperacillin-tazobactam (ZOSYN) IVPB 3.375 g  Status:  Discontinued     3.375 g 12.5 mL/hr over 240 Minutes Intravenous Every 8 hours 03/02/16 0005 03/05/16 0749   03/02/16 0015   fluconazole (DIFLUCAN) IVPB 400 mg     400 mg 100 mL/hr over 120 Minutes Intravenous  Once 03/01/16 2338 03/02/16 0242   03/02/16 0015  vancomycin (VANCOCIN) IVPB 750 mg/150 ml premix     750 mg 150 mL/hr over 60 Minutes Intravenous  Once 03/02/16 0005 03/02/16 0140      Procedures: None at this time  CONSULTS: Cardiology Vascular Surgery, Dr. Randie Heinz Neurology  Time spent: 25 minutes-Greater than 50% of this time was spent in counseling, explanation of diagnosis, planning of further management, and coordination of care.  MEDICATIONS: Scheduled Meds: . apixaban  5 mg Oral BID  . aspirin EC  81 mg Oral Daily  . atorvastatin  40 mg Oral q1800  . cholecalciferol  5,000 Units Oral Daily  . fluconazole (DIFLUCAN) IV  100 mg Intravenous Q24H  . gabapentin  300 mg Oral BID  . insulin aspart  0-9 Units Subcutaneous Q4H  . insulin glargine  18 Units Subcutaneous QHS  . levothyroxine  50 mcg Oral QAC breakfast  . metoprolol tartrate  12.5 mg Oral BID  . nystatin  5 mL Mouth/Throat QID  . potassium chloride  20 mEq Oral BID  . ranolazine  500 mg Oral BID  . tamsulosin  0.4 mg Oral Daily   Continuous Infusions: . sodium chloride 75 mL/hr at 03/01/16 2345   PRN Meds:.acetaminophen **OR** acetaminophen (TYLENOL) oral liquid 160 mg/5 mL **OR** acetaminophen, meclizine, metoprolol, morphine injection, senna-docusate   PHYSICAL EXAM: Vital signs: Vitals:   03/04/16 2150 03/05/16 0033 03/05/16 0500 03/05/16 0931  BP: (!) 181/67 (!) 155/55 (!) 147/58 (!) 159/58  Pulse:  61 68 63  Resp:  16 18 20   Temp:  98.9 F (37.2 C) 99 F (37.2 C) 97.9 F (36.6 C)  TempSrc:  Oral Oral Oral  SpO2:  94% 97% 97%  Weight:      Height:       Filed Weights   03/01/16 2143  Weight: 76.5 kg (168 lb 9.6 oz)   Body mass index is 28.94 kg/m.   General appearance: Awake, alert, not in any distress. Speech Clear. Not toxic Looking Eyes: PERRLA,no scleral icterus.Pink conjunctiva HEENT: Atraumatic  and Normocephalic Neck: Supple, no JVD. No cervical lymphadenopathy. No thyromegaly Resp:Good air entry bilaterally, no added sounds  CVS: S1 S2 regular, no murmurs.  GI: Bowel sounds present, Non tender and not distended with no gaurding, rigidity or rebound.No organomegaly Extremities: B/L Lower Ext shows no edema, both legs are warm to touch Neurology:  Speech clear,Non focal, sensation is grossly intact. Psychiatric: Normal judgment and insight. AAO x 3. Normal mood. Musculoskeletal: No digital cyanosis Skin: No Rash, warm and dry Wounds: N/A  I have personally reviewed following labs and imaging studies  LABORATORY DATA: CBC:  Recent Labs Lab 03/02/16 0332 03/03/16 0327 03/04/16 0535 03/05/16 0203  WBC 8.0 5.6 6.6 5.7  NEUTROABS 5.9  --   --   --   HGB 11.1* 10.3* 11.1* 10.3*  HCT 33.8* 32.0* 34.1* 31.5*  MCV 93.4 93.8 93.9 92.6  PLT 116* 120* 139* 194    Basic Metabolic Panel:  Recent Labs Lab 03/02/16 0332 03/04/16 0303 03/05/16 0203  NA 138 144 141  K 3.4* 3.3* 4.0  CL 102 106 106  CO2 25 27  28  GLUCOSE 245* 55* 94  BUN 11 7 <5*  CREATININE 0.97 1.00 0.97  CALCIUM 8.1* 8.7* 8.5*    GFR: Estimated Creatinine Clearance: 41.7 mL/min (by C-G formula based on SCr of 0.97 mg/dL).  Liver Function Tests:  Recent Labs Lab 03/02/16 0332  AST 35  ALT 18  ALKPHOS 39  BILITOT 0.9  PROT 5.3*  ALBUMIN 2.6*   No results for input(s): LIPASE, AMYLASE in the last 168 hours. No results for input(s): AMMONIA in the last 168 hours.  Coagulation Profile: No results for input(s): INR, PROTIME in the last 168 hours.  Cardiac Enzymes:  Recent Labs Lab 03/02/16 0332 03/02/16 1014 03/02/16 1627  TROPONINI 1.52* 2.46* 2.38*    BNP (last 3 results) No results for input(s): PROBNP in the last 8760 hours.  HbA1C: No results for input(s): HGBA1C in the last 72 hours.  CBG:  Recent Labs Lab 03/04/16 2019 03/05/16 0032 03/05/16 0448 03/05/16 0740  03/05/16 1119  GLUCAP 150* 87 80 66 169*    Lipid Profile: No results for input(s): CHOL, HDL, LDLCALC, TRIG, CHOLHDL, LDLDIRECT in the last 72 hours.  Thyroid Function Tests: No results for input(s): TSH, T4TOTAL, FREET4, T3FREE, THYROIDAB in the last 72 hours.  Anemia Panel: No results for input(s): VITAMINB12, FOLATE, FERRITIN, TIBC, IRON, RETICCTPCT in the last 72 hours.  Urine analysis: No results found for: COLORURINE, APPEARANCEUR, LABSPEC, PHURINE, GLUCOSEU, HGBUR, BILIRUBINUR, KETONESUR, PROTEINUR, UROBILINOGEN, NITRITE, LEUKOCYTESUR  Sepsis Labs: Lactic Acid, Venous No results found for: LATICACIDVEN  MICROBIOLOGY: No results found for this or any previous visit (from the past 240 hour(s)).  RADIOLOGY STUDIES/RESULTS: Ct Angio Head W Or Wo Contrast  Result Date: 03/03/2016 CLINICAL DATA:  Initial evaluation for carotid artery stenosis. EXAM: CT ANGIOGRAPHY HEAD AND NECK TECHNIQUE: Multidetector CT imaging of the head and neck was performed using the standard protocol during bolus administration of intravenous contrast. Multiplanar CT image reconstructions and MIPs were obtained to evaluate the vascular anatomy. Carotid stenosis measurements (when applicable) are obtained utilizing NASCET criteria, using the distal internal carotid diameter as the denominator. CONTRAST:  50 cc of Isovue 370. COMPARISON:  None. FINDINGS: CT HEAD FINDINGS Brain: Atrophy with moderate chronic microvascular ischemic disease. Remote lacunar infarct present within the left caudate head. No acute intracranial hemorrhage. No evidence for acute large vessel territory infarct. Previous identified small infarct within the right frontal parietal region not seen. No mass lesion, midline shift or mass effect. Ventricular prominence related to global parenchymal volume loss without hydrocephalus. No extra-axial fluid collection. Vascular: No hyperdense vessel. Scattered vascular calcifications noted within the  carotid siphons. Skull: Scalp soft tissues within normal limits.  Calvarium intact. Sinuses: Scattered mucosal thickening within the ethmoidal air cells, sphenoid sinuses, and maxillary sinuses. Trace right mastoid effusion. Orbits: Globes and orbital soft tissues within normal limits. Patient is status post lens extraction bilaterally. CTA NECK FINDINGS Aortic arch: Extensive atheromatous irregularity throughout the aortic arch. Arch itself of normal caliber with normal 3 vessel morphology. Atheromatous plaque about the origin of the great vessels. There is moderate narrowing of approximately 50% at the proximal right brachiocephalic artery (series 11, image 3 weight). No other significant stenosis at the origin of the great vessels. Extensive plaque throughout the visualized subclavian arteries without significant stenosis. Right carotid system: Scattered eccentric calcified plaque throughout the right common carotid artery without flow-limiting stenosis. Multifocal atheromatous stenoses at the proximal right subclavian artery measuring up to 50% by NASCET criteria area of involved extends from  the carotid bifurcation and measures approximately 16 mm in length distally, right ICA tortuous but patent to the skullbase without additional stenosis or other vascular abnormality. Left carotid system: Scattered eccentric plaque within the left common carotid artery without flow-limiting stenosis. Calcified and noncalcified plaque at the left carotid bifurcation/proximal left ICA with associated severe near occlusive high-grade stenosis (greater than 90%). A string sign is present (series 12, image 117). Area involvement extends from the carotid bifurcation in measures approximately 21 mm in length. Distally, left ICA tortuous but widely patent to the skullbase without additional stenosis or other acute vascular abnormality. No made of moderate to severe narrowing at the origin of the left external carotid artery is well.  Vertebral arteries: Both of the vertebral arteries arise from the subclavian arteries. Severe stenosis at the origin of the right vertebral artery (series 11, image 261). More moderate narrowing at the origin of the left vertebral artery. Left vertebral artery is dominant. There are additional multifocal moderate to severe stenoses within the right V1/V2 segments (series 11, image 228, 214). Additional atheromatous irregularity within the hypoplastic right vertebral artery distally without severe stenosis. Multifocal plaque present throughout the left V2 and V3 segments with mild to moderate multifocal narrowing. No high-grade flow-limiting stenosis. Skeleton: No acute osseous abnormality. No worrisome lytic or blastic osseous lesions. Moderate multilevel degenerative spondylolysis noted within the cervical spine. Prominent broad posterior disc protrusion noted at C4-5 with resultant and least moderate stenosis. Other neck: Visualized soft tissues of the neck demonstrate no acute abnormality. No adenopathy. Thyroid within normal limits. Upper chest: Few mildly prominent pretracheal lymph nodes measure at the upper limits of normal at 1 cm in short axis. Mild esophageal wall thickening, which may be related to reflux disease or possibly acute esophagitis. Small layering bilateral pleural effusions with associated atelectatic changes. Patchy opacity within the superior segment right lower lobe may reflect infiltrate. Review of the MIP images confirms the above findings CTA HEAD FINDINGS Anterior circulation: Petrous segments widely patent bilaterally. Scattered atheromatous plaque throughout the carotid siphons with mild to moderate diffuse narrowing. ICA termini widely patent. Left A1 segment widely patent. Right A1 segment hypoplastic and/ or absent, likely accounting for the slightly diminutive right ICA is compared to the left. Anterior communicating artery normal. Short-segment severe left A2 stenosis noted  (series 11, image 8). ACA is otherwise patent to their distal aspects. For Short-segment severe proximal left M1 stenosis, extending from the terminus and measuring 5 mm in length (series 11, image 96). Left M1 segment otherwise widely patent. Small vessel atheromatous irregularity throughout the left MCA branches. Right M1 segment irregular with moderate diffuse narrowing distally. Several proximal moderate to severe right M2 stenoses noted. Small vessel irregularity throughout the right MCA branches, which are well perfused and fairly symmetric with the left. Posterior circulation: Scattered multifocal plaque within the dominant left vertebral artery as it crosses the dural margin. Associated mild to moderate multifocal narrowing. Hypoplastic right vertebral artery irregular and terminates in PICA. Posterior inferior cerebral arteries themselves are patent proximally. Basilar artery diminutive and mildly irregular but patent to its distal aspect without high-grade stenosis. Superior cerebral arteries grossly patent. Fetal type right PCA supplied via a new right posterior communicating artery. Left PCA supplied via the basilar artery. Extensive atheromatous irregularity throughout the P2 segments with multifocal moderate to severe stenoses. Additional atheromatous irregularity within the distal PCA branches. Right PCA is not well seen distally, and is attenuated as compared to the left. Venous sinuses: Patent. Venous  collateralization into the posterior aspect noted from the proximal right internal jugular vein. Anatomic variants: No significant anatomic variant. No aneurysm or vascular malformation. Delayed phase: No pathologic enhancement. Review of the MIP images confirms the above findings IMPRESSION: CTA NECK IMPRESSION: 1. Severe near occlusive stenosis involving the proximal left ICA of greater than 90%. Stenosis begins at the bifurcation, and measures approximately 21 mm in length. 2. Multifocal  atheromatous stenoses involving the proximal right ICA of up to 50%. Area affected begins at the right carotid bifurcation, and measures approximately 16 mm in length. 3. Multifocal severe proximal right vertebral artery stenoses as above, with more mild to moderate narrowing throughout the left vertebral artery. Left vertebral artery is dominant. 4. 50% stenosis at the proximal right brachiocephalic artery. No other high-grade stenosis about the origin of the great vessels. 5. Layering bilateral pleural effusions with associated atelectasis. Possible superimposed infiltrate within the superior segment of the right lower lobe. CTA HEAD IMPRESSION: 1. 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 2. Extensive atheromatous disease throughout the posterior circulation as above, most notable within the bilateral PCAs. Dominant left vertebral artery and basilar artery are patent but irregular with multifocal stenoses as above. 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 Electronically Signed   By: Rise MuBenjamin  McClintock M.D.   On: 03/03/2016 18:59   Ct Angio Neck W Or Wo Contrast  Result Date: 03/03/2016 CLINICAL DATA:  Initial evaluation for carotid artery stenosis. EXAM: CT ANGIOGRAPHY HEAD AND NECK TECHNIQUE: Multidetector CT imaging of the head and neck was performed using the standard protocol during bolus administration of intravenous contrast. Multiplanar CT image reconstructions and MIPs were obtained to evaluate the vascular anatomy. Carotid stenosis measurements (when applicable) are obtained utilizing NASCET criteria, using the distal internal carotid diameter as the denominator. CONTRAST:  50 cc of Isovue 370. COMPARISON:  None. FINDINGS: CT HEAD FINDINGS Brain: Atrophy with moderate chronic microvascular ischemic disease. Remote lacunar infarct present within the left caudate head. No acute intracranial  hemorrhage. No evidence for acute large vessel territory infarct. Previous identified small infarct within the right frontal parietal region not seen. No mass lesion, midline shift or mass effect. Ventricular prominence related to global parenchymal volume loss without hydrocephalus. No extra-axial fluid collection. Vascular: No hyperdense vessel. Scattered vascular calcifications noted within the carotid siphons. Skull: Scalp soft tissues within normal limits.  Calvarium intact. Sinuses: Scattered mucosal thickening within the ethmoidal air cells, sphenoid sinuses, and maxillary sinuses. Trace right mastoid effusion. Orbits: Globes and orbital soft tissues within normal limits. Patient is status post lens extraction bilaterally. CTA NECK FINDINGS Aortic arch: Extensive atheromatous irregularity throughout the aortic arch. Arch itself of normal caliber with normal 3 vessel morphology. Atheromatous plaque about the origin of the great vessels. There is moderate narrowing of approximately 50% at the proximal right brachiocephalic artery (series 11, image 3 weight). No other significant stenosis at the origin of the great vessels. Extensive plaque throughout the visualized subclavian arteries without significant stenosis. Right carotid system: Scattered eccentric calcified plaque throughout the right common carotid artery without flow-limiting stenosis. Multifocal atheromatous stenoses at the proximal right subclavian artery measuring up to 50% by NASCET criteria area of involved extends from the carotid bifurcation and measures approximately 16 mm in length distally, right ICA tortuous but patent to the skullbase without additional stenosis or other vascular abnormality. Left carotid system: Scattered eccentric plaque within the left common carotid artery without  flow-limiting stenosis. Calcified and noncalcified plaque at the left carotid bifurcation/proximal left ICA with associated severe near occlusive high-grade  stenosis (greater than 90%). A string sign is present (series 12, image 117). Area involvement extends from the carotid bifurcation in measures approximately 21 mm in length. Distally, left ICA tortuous but widely patent to the skullbase without additional stenosis or other acute vascular abnormality. No made of moderate to severe narrowing at the origin of the left external carotid artery is well. Vertebral arteries: Both of the vertebral arteries arise from the subclavian arteries. Severe stenosis at the origin of the right vertebral artery (series 11, image 261). More moderate narrowing at the origin of the left vertebral artery. Left vertebral artery is dominant. There are additional multifocal moderate to severe stenoses within the right V1/V2 segments (series 11, image 228, 214). Additional atheromatous irregularity within the hypoplastic right vertebral artery distally without severe stenosis. Multifocal plaque present throughout the left V2 and V3 segments with mild to moderate multifocal narrowing. No high-grade flow-limiting stenosis. Skeleton: No acute osseous abnormality. No worrisome lytic or blastic osseous lesions. Moderate multilevel degenerative spondylolysis noted within the cervical spine. Prominent broad posterior disc protrusion noted at C4-5 with resultant and least moderate stenosis. Other neck: Visualized soft tissues of the neck demonstrate no acute abnormality. No adenopathy. Thyroid within normal limits. Upper chest: Few mildly prominent pretracheal lymph nodes measure at the upper limits of normal at 1 cm in short axis. Mild esophageal wall thickening, which may be related to reflux disease or possibly acute esophagitis. Small layering bilateral pleural effusions with associated atelectatic changes. Patchy opacity within the superior segment right lower lobe may reflect infiltrate. Review of the MIP images confirms the above findings CTA HEAD FINDINGS Anterior circulation: Petrous  segments widely patent bilaterally. Scattered atheromatous plaque throughout the carotid siphons with mild to moderate diffuse narrowing. ICA termini widely patent. Left A1 segment widely patent. Right A1 segment hypoplastic and/ or absent, likely accounting for the slightly diminutive right ICA is compared to the left. Anterior communicating artery normal. Short-segment severe left A2 stenosis noted (series 11, image 8). ACA is otherwise patent to their distal aspects. For Short-segment severe proximal left M1 stenosis, extending from the terminus and measuring 5 mm in length (series 11, image 96). Left M1 segment otherwise widely patent. Small vessel atheromatous irregularity throughout the left MCA branches. Right M1 segment irregular with moderate diffuse narrowing distally. Several proximal moderate to severe right M2 stenoses noted. Small vessel irregularity throughout the right MCA branches, which are well perfused and fairly symmetric with the left. Posterior circulation: Scattered multifocal plaque within the dominant left vertebral artery as it crosses the dural margin. Associated mild to moderate multifocal narrowing. Hypoplastic right vertebral artery irregular and terminates in PICA. Posterior inferior cerebral arteries themselves are patent proximally. Basilar artery diminutive and mildly irregular but patent to its distal aspect without high-grade stenosis. Superior cerebral arteries grossly patent. Fetal type right PCA supplied via a new right posterior communicating artery. Left PCA supplied via the basilar artery. Extensive atheromatous irregularity throughout the P2 segments with multifocal moderate to severe stenoses. Additional atheromatous irregularity within the distal PCA branches. Right PCA is not well seen distally, and is attenuated as compared to the left. Venous sinuses: Patent. Venous collateralization into the posterior aspect noted from the proximal right internal jugular vein.  Anatomic variants: No significant anatomic variant. No aneurysm or vascular malformation. Delayed phase: No pathologic enhancement. Review of the MIP images confirms the above findings  IMPRESSION: CTA NECK IMPRESSION: 1. Severe near occlusive stenosis involving the proximal left ICA of greater than 90%. Stenosis begins at the bifurcation, and measures approximately 21 mm in length. 2. Multifocal atheromatous stenoses involving the proximal right ICA of up to 50%. Area affected begins at the right carotid bifurcation, and measures approximately 16 mm in length. 3. Multifocal severe proximal right vertebral artery stenoses as above, with more mild to moderate narrowing throughout the left vertebral artery. Left vertebral artery is dominant. 4. 50% stenosis at the proximal right brachiocephalic artery. No other high-grade stenosis about the origin of the great vessels. 5. Layering bilateral pleural effusions with associated atelectasis. Possible superimposed infiltrate within the superior segment of the right lower lobe. CTA HEAD IMPRESSION: 1. 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 2. Extensive atheromatous disease throughout the posterior circulation as above, most notable within the bilateral PCAs. Dominant left vertebral artery and basilar artery are patent but irregular with multifocal stenoses as above. 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 Electronically Signed   By: Rise Mu M.D.   On: 03/03/2016 18:59   Dg Chest Port 1v Same Day  Result Date: 03/02/2016 CLINICAL DATA:  Shortness of breath. EXAM: PORTABLE CHEST 1 VIEW COMPARISON:  None. FINDINGS: The heart size and mediastinal contours are within normal limits. Atherosclerosis of thoracic aorta is noted. No pneumothorax is noted. Left lung is clear. Right infrahilar opacity is noted concerning for pneumonia. No significant pleural  effusion is noted. The visualized skeletal structures are unremarkable. IMPRESSION: Right infrahilar opacity concerning for pneumonia or edema. Aortic atherosclerosis. Followup PA and lateral chest X-ray is recommended in 3-4 weeks following trial of antibiotic therapy to ensure resolution and exclude underlying malignancy. Electronically Signed   By: Lupita Raider, M.D.   On: 03/02/2016 08:09   Dg Swallowing Func-speech Pathology  Result Date: 03/03/2016 Objective Swallowing Evaluation: Type of Study: MBS-Modified Barium Swallow Study Patient Details Name: Dyamond Tolosa MRN: [id number] Date of Birth: [birth year] Today's Date: 03/03/2016 Time: SLP Start Time (ACUTE ONLY): 1348-SLP Stop Time (ACUTE ONLY): 1358 SLP Time Calculation (min) (ACUTE ONLY): 10 min Past Medical History: Past Medical History: Diagnosis Date . Anxiety  . Depression  . Diabetes (HCC)  . Headache 06/09/2015 . High cholesterol  . Hypertension  . Macular degeneration  . Migraine  . Syncope and collapse 06/09/2015 Past Surgical History: Past Surgical History: Procedure Laterality Date . ABDOMINAL HYSTERECTOMY   . APPENDECTOMY   . CAROTID ARTERY ANGIOPLASTY Right  . CATARACT EXTRACTION Bilateral  . LUMBAR LAMINECTOMY   HPI: 83 y.o.femalewith medical history significant for insulin-dependent diabetes mellitus, anxiety, depression, and hypertension who presented to [facility] emergency department on 02/27/2016 for evaluation of several days of generalized weakness, altered mental status, and hypotension. She was treated for UTI without improved mentation. MRI 1/29 showed acute infarct in the right frontoparietal junction. CXR 1/30 showed new opacity in the RLL consistent with PNA or aspiration. Pt has been treated for oral candidiasis and PNA. Daughter reports coughing and difficulty swallowing solids with odnyophagia.  Subjective: pt alert, delayed processing Assessment / Plan / Recommendation CHL IP CLINICAL IMPRESSIONS 03/03/2016 Therapy  Diagnosis Mild oral phase dysphagia Clinical Impression Pt has a mild oral dysphagia but with no aspiration or penetration observed. View of oral phase was limited by dental hardware, but from what could be seen, there was oral residue left behind by pureed and soft solids.  Pt  did a spontaneous second swallow to reduce residue. She had difficulty propeling a pill posterior with thin liquids, and needed multiple liquid washes in an attempt to clear. Of note, there was no coughing observed during MBS as there has been at bedside. Recommend that pt remain on current Dys 2 diet and thin liquids. SLP to f/u for solid advancement as appropriate. Impact on safety and function Mild aspiration risk   CHL IP TREATMENT RECOMMENDATION 03/03/2016 Treatment Recommendations Therapy as outlined in treatment plan below   Prognosis 03/03/2016 Prognosis for Safe Diet Advancement Good Barriers to Reach Goals Cognitive deficits Barriers/Prognosis Comment -- CHL IP DIET RECOMMENDATION 03/03/2016 SLP Diet Recommendations Dysphagia 2 (Fine chop) solids;Thin liquid Liquid Administration via Cup;Straw Medication Administration Whole meds with puree Compensations Slow rate;Minimize environmental distractions;Small sips/bites;Follow solids with liquid Postural Changes Seated upright at 90 degrees   CHL IP OTHER RECOMMENDATIONS 03/03/2016 Recommended Consults -- Oral Care Recommendations Oral care BID Other Recommendations --   CHL IP FOLLOW UP RECOMMENDATIONS 03/03/2016 Follow up Recommendations Inpatient Rehab   CHL IP FREQUENCY AND DURATION 03/03/2016 Speech Therapy Frequency (ACUTE ONLY) min 2x/week Treatment Duration 2 weeks      CHL IP ORAL PHASE 03/03/2016 Oral Phase Impaired Oral - Pudding Teaspoon -- Oral - Pudding Cup -- Oral - Honey Teaspoon -- Oral - Honey Cup -- Oral - Nectar Teaspoon -- Oral - Nectar Cup -- Oral - Nectar Straw -- Oral - Thin Teaspoon -- Oral - Thin Cup WFL Oral - Thin Straw WFL Oral - Puree Lingual/palatal residue;Delayed oral  transit;Reduced posterior propulsion Oral - Mech Soft Lingual/palatal residue;Delayed oral transit;Reduced posterior propulsion Oral - Regular -- Oral - Multi-Consistency -- Oral - Pill Delayed oral transit;Reduced posterior propulsion Oral Phase - Comment --  CHL IP PHARYNGEAL PHASE 03/03/2016 Pharyngeal Phase WFL Pharyngeal- Pudding Teaspoon -- Pharyngeal -- Pharyngeal- Pudding Cup -- Pharyngeal -- Pharyngeal- Honey Teaspoon -- Pharyngeal -- Pharyngeal- Honey Cup -- Pharyngeal -- Pharyngeal- Nectar Teaspoon -- Pharyngeal -- Pharyngeal- Nectar Cup -- Pharyngeal -- Pharyngeal- Nectar Straw -- Pharyngeal -- Pharyngeal- Thin Teaspoon -- Pharyngeal -- Pharyngeal- Thin Cup -- Pharyngeal -- Pharyngeal- Thin Straw -- Pharyngeal -- Pharyngeal- Puree -- Pharyngeal -- Pharyngeal- Mechanical Soft -- Pharyngeal -- Pharyngeal- Regular -- Pharyngeal -- Pharyngeal- Multi-consistency -- Pharyngeal -- Pharyngeal- Pill -- Pharyngeal -- Pharyngeal Comment --  CHL IP CERVICAL ESOPHAGEAL PHASE 03/03/2016 Cervical Esophageal Phase WFL Pudding Teaspoon -- Pudding Cup -- Honey Teaspoon -- Honey Cup -- Nectar Teaspoon -- Nectar Cup -- Nectar Straw -- Thin Teaspoon -- Thin Cup -- Thin Straw -- Puree -- Mechanical Soft -- Regular -- Multi-consistency -- Pill -- Cervical Esophageal Comment -- No flowsheet data found. Maxcine Ham 03/03/2016, 2:26 PM  Maxcine Ham, M.A. CCC-SLP [phone]               LOS: 4 days   Signature  Susa Raring K M.D on 03/05/2016 at 11:49 AM  Between 7am to 7pm - Pager - [phone], After 7pm go to [email or web address] - password [facility]  [facility]  - Office  [phone]

## 2016-03-05 NOTE — Progress Notes (Signed)
STROKE TEAM PROGRESS NOTE   HISTORY OF PRESENT ILLNESS (per record) Valerie Arnold is an 83 y.o. female who initially presented at the [facility] ED on 1/28 with a several day history of generalized weakness, AMS and hypotension. Her troponin was markedly elevated. Cardiology consult recommended anticoagulation with Lovenox. Her troponin trended back down and she was started on ASA and a statin. He AMS did not improve with rx of her UTI. An MRI brain on 1/29 revealed a 3-4 mm acute infarction in the right frontoparietal junction. Plan was to continue ASA and then change to Plavix. Carotid ultrasound showed high grade left ICA origin stenosis with near occlusion. Vascular surgery felt that a waiting period of 2-3 weeks would be needed prior to CEA given acute MRI and acute CVA.   The patient has had problems with swallowing and has been choking, per daughter. She was treated with Zosyn for suspected aspiration pneumonia as well as her UTI.   Echocardiogram revealed akinesis of the basal inferior wall with overall normal LV systolic function; grade 1 diastolic dysfunction with elevated LV filling pressure; moderate LVH; calcified aortic valve with no AS by doppler; moderate, posterior directed MR; mild TR.  Patient was not administered IV t-PA secondary to . She was admitted for further evaluation and treatment.   SUBJECTIVE (INTERVAL HISTORY) Family at bedside.  Patient was without complaints today on full ROS.  She seems more alert and interactive  OBJECTIVE Temp:  [97.8 F (36.6 C)-99 F (37.2 C)] 97.9 F (36.6 C) (02/04 0931) Pulse Rate:  [61-74] 63 (02/04 0931) Cardiac Rhythm: Normal sinus rhythm;Bundle branch block;Heart block (02/04 0703) Resp:  [15-20] 20 (02/04 0931) BP: (136-194)/(52-68) 159/58 (02/04 0931) SpO2:  [94 %-99 %] 97 % (02/04 0931)  CBC:   Recent Labs Lab 03/02/16 0332  03/04/16 0535 03/05/16 0203  WBC 8.0  < > 6.6 5.7  NEUTROABS 5.9  --   --   --   HGB  11.1*  < > 11.1* 10.3*  HCT 33.8*  < > 34.1* 31.5*  MCV 93.4  < > 93.9 92.6  PLT 116*  < > 139* 194  < > = values in this interval not displayed.  Basic Metabolic Panel:   Recent Labs Lab 03/04/16 0303 03/05/16 0203  NA 144 141  K 3.3* 4.0  CL 106 106  CO2 27 28  GLUCOSE 55* 94  BUN 7 <5*  CREATININE 1.00 0.97  CALCIUM 8.7* 8.5*    Lipid Panel:     Component Value Date/Time   CHOL 161 03/02/2016 0332   TRIG 172 (H) 03/02/2016 0332   HDL 27 (L) 03/02/2016 0332   CHOLHDL 6.0 03/02/2016 0332   VLDL 34 03/02/2016 0332   LDLCALC 100 (H) 03/02/2016 0332   HgbA1c:  Lab Results  Component Value Date   HGBA1C 6.6 (H) 03/02/2016   Urine Drug Screen: No results found for: LABOPIA, COCAINSCRNUR, LABBENZ, AMPHETMU, THCU, LABBARB    IMAGING I have personally reviewed the radiological images below and agree with the radiology interpretations.  CT angiogram head and neck Pending  03/03/2016  CTA NECK  1. Severe near occlusive stenosis involving the proximal left ICA of greater than 90%.   Stenosis begins at the bifurcation, and measures approximately 21 mm in length. 2. Multifocal atheromatous stenoses involving the proximal right ICA of up to 50%. Area affected begins at the right carotid  bifurcation, and measures approximately 16 mm in length. 3. Multifocal severe proximal right vertebral artery  stenoses as above, with more mild to moderate narrowing throughout the left vertebral artery. Left vertebral artery is dominant. 4. 50% stenosis at the proximal right brachiocephalic artery. No other high-grade stenosis about the origin of the great vessels. 5. Layering bilateral pleural effusions with associated atelectasis.   Possible superimposed infiltrate within the superior segment of the right lower lobe.   CTA HEAD   1. 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 2. Extensive  atheromatous disease throughout the posterior circulation as above, most notable within the bilateral PCAs. Dominant left vertebral artery and basilar artery are patent but irregular with multifocal stenoses as above. 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  2-D echocardiogram - Left ventricle: The cavity size was normal. Wall thickness was increased in a pattern of moderate LVH. Systolic function was normal. The estimated ejection fraction was in the range of 55% to 60%. There is akinesis of the basalinferior myocardium. Doppler parameters are consistent with abnormal left ventricular relaxation (grade 1 diastolic dysfunction). Doppler parameters are consistent with high ventricular filling pressure. - Mitral valve: There was moderate regurgitation. - Pulmonary arteries: PA peak pressure: 34 mm Hg (S). - Pericardium, extracardiac: A trivial pericardial effusion was identified. Impressions: - Akinesis of the basal inferior wall with overall normal LV  systolic function; grade 1 diastolic dysfunction with elevated LV filling pressure; moderate LVH; calcified aortic valve with no AS by doppler; moderate, posterior directed MR; mild TR.  Carotid Doppler upper range 40-59% versus low range 60-79% right internal carotid artery stenosis and 80-99% left internal carotid artery stenosis. Vertebral arteries are patent with antegrade flow.  PHYSICAL EXAM General Examination:                                                                                                      HEENT-  Normocephalic/atraumatic.   Lungs- No gross wheezing. Respirations unlabored.  Extremities- Warm and well perfused.   Neurological Examination Mental Status: Drowsy. Oriented to self. States that she is at home. Not oriented to city or situation.  Intact comprehension of commands. No expressive aphasia noted.  Cranial Nerves: II: Visual fields grossly intact to bedside confrontation. PERRL. III,IV, VI: ptosis  not present, EOMI without nystagmus V,VII: smile symmetric, facial temp sensation normal bilaterally VIII: hearing intact to questions and commands XI: Symmetric XII: Non-cooperative Motor: Exam limited by lack of cooperation. Strength assessed by observing spontaneous movements and with light noxious stimuli.  Right :  Upper extremity   4-5/5                       Left:     Upper extremity   4-5/5             Lower extremity   4-5/5                                   Lower extremity   4-5/5 No asymmetry noted.  Sensory: Reacts to FT in all 4 extremities.  Deep Tendon Reflexes: 0 patellae and achilles. 1+ upper extremities.  Plantars: Equivocal.  Cerebellar: Slow FNF bilaterally with questionable mild ataxia on the left.  Gait: Deferred  ASSESSMENT/PLAN Ms. Valerie Arnold is a 83 y.o. female with history of diabetes, hypertension, chronic kidney disease stage II and hyperlipidemia with no prior coronary artery disease. He was [facility] from [facility] with ischemic stroke.  New A. fib on admission to [facility] and [facility]I at [facility].   Stroke:  right frontoparietal subcortical punctate infarct embolic likely secondary to new diagnosis atrial fibrillation   CTA Neck - Left ICA greater than 90%. Multifocal severe R VA stenoses. Possible RLL infiltrate.  CTA Head - Severe proximal left M1 and left A2 stenosis. Extensive atheromatous disease posterior circulation.   Carotid Doppler  R ICA 40-59% to 60-79% stenosis, L ICA 80-99% stenosis  2D Echo  EF 55-60%. No obvious source of embolus  LDL 100  HgbA1c 6.6  IV heparin for VTE prophylaxis DIET DYS 2 Room service appropriate? Yes; Fluid consistency: Thin  No antithrombotic prior to admission, now on Eliquis.  Ongoing aggressive stroke risk factor management  Therapy recommendations:  CIR. Consult requested.  Disposition:  pending   Patient followed with Dr. Anne HahnWillis in [facility] in the past  Atrial Fibrillation w/  RVR  New diagnosis this admission  Spontaneously converted to sinus rhythm with metoprolol   CHA2DS2-VASc Score = 8  Anticoagulation recommended, -> now on eliquis for stroke prevention.  Carotid stenosis   Carotid Doppler  R ICA 40-59% to 60-79% stenosis, L ICA 80-99%  Left ICA stenosis likely to be asymptomatic  CTA head and neck - as above.  Followed by the [facility]  Surgery planned in 2-3 weeks  Continue aspirin  NSTEMI  Elev[facility] to transfer here  Cardiology on board  On heparin IV and aspirin  Hypertension  Stable  Permissive hypertension (OK if < 180/105) but gradually normalize in 5-7 days  Long-term BP goal normotensive  Hyperlipidemia  Home meds:  No statin  LDL 100, goal < 70  Now on Lipitor 40 mg daily  Continue statin at discharge  Diabetes type II  HgbA1c 6.6, goal < 7.0  Controlled  On Lantus  SSI  Other Stroke Risk Factors  Advanced age  Migraines  Other Active Problems  Chronic kidney disease stage II  Dysphagia secondary to stroke  Oral candidiasis  Hypothyroidism  Generalized weakness/physical deconditioning.  Hypokalemia - 3.3 -> supplemented and rechecked in AM -> 4.0  ATTENDING NOTE: Patient was seen and examined by me personally. Documentation reflects findings. The laboratory and radiographic studies reviewed by me. ROS completed by me personally and pertinent positives fully documented  Condition: stable  Assessment and plan completed by me personally and fully documented above. Plans/Recommendations include:     Vascular Surgery plans to allow patient to recover from this recent event before proceeding with surgery.  I discussed the pros and cons  Of waiting until the patient is "stronger" with the daughter.  She expressed understanding the risks and benefits  Stroke work-up complete,  No further recommendations at this time  SIGNED BY: Dr. Alesia Bandahere Sky Primo    Hospital day #  4  To contact Stroke Continuity provider, please refer to [email or web address]. After hours, contact General Neurology

## 2016-03-05 NOTE — Progress Notes (Signed)
CSW spoke with patient and multiple family members via bedside. Family asked questions regarding discharge plans. Family is agreeable to SNF placement and prefers [facility]. CSW will complete FL2 and fax information to SNF's.  Stacy GardnerErin Lorely Bubb, LCSWA Clinical Social Worker [phone]

## 2016-03-05 NOTE — [place] FL2 (Signed)
[place] MEDICAID FL2 LEVEL OF CARE SCREENING TOOL     IDENTIFICATION  Patient Name: Valerie MylarMargie Arnold Birthdate: 05/28/1927 Sex: female Admission Date (Current Location): 03/01/2016  [facility] [place]   Facility and Address:  [facility], [address], [place], [place] [address]      Provider Number: [id number]  Attending Physician Name and Address:  Leroy SeaPrashant K Singh, MD  Relative Name and Phone Number:       Current Level of Care: Hospital Recommended Level of Care: Skilled Nursing Facility Prior Approval Number:    Date Approved/Denied:   PASRR Number:   [id number]   Discharge Plan: SNF    Current Diagnoses: Patient Active Problem List   Diagnosis Date Noted  . New onset atrial fibrillation (HCC) 03/02/2016  . Atrial fibrillation with RVR (HCC) 03/02/2016  . CAD (coronary artery disease) 03/01/2016  . Acute CVA (cerebrovascular accident) (HCC) 03/01/2016  . UTI (urinary tract infection) 03/01/2016  . HAP (hospital-acquired pneumonia) 03/01/2016  . NSTEMI (non-ST elevated myocardial infarction) (HCC) 03/01/2016  . Type II diabetes mellitus (HCC) 03/01/2016  . Essential hypertension 03/01/2016  . Memory disorder 09/09/2015  . Carotid disease, bilateral (HCC) 09/09/2015  . Syncope and collapse 06/09/2015  . Headache 06/09/2015    Orientation RESPIRATION BLADDER Height & Weight     Place, Self  Normal Continent Weight: 168 lb 9.6 oz (76.5 kg) Height:  5\' 4"  (162.6 cm)  BEHAVIORAL SYMPTOMS/MOOD NEUROLOGICAL BOWEL NUTRITION STATUS      Continent Diet (DYS 2)  AMBULATORY STATUS COMMUNICATION OF NEEDS Skin   Limited Assist Verbally Normal                       Personal Care Assistance Level of Assistance  Bathing, Feeding, Dressing Bathing Assistance: Limited assistance Feeding assistance: Independent Dressing Assistance: Limited assistance     Functional Limitations Info             SPECIAL CARE  FACTORS FREQUENCY  PT (By licensed PT), OT (By licensed OT)     PT Frequency: 5 OT Frequency: 5            Contractures      Additional Factors Info  Code Status, Allergies Code Status Info: DNR Allergies Info: NKA           Current Medications (03/05/2016):  This is the current hospital active medication list Current Facility-Administered Medications  Medication Dose Route Frequency Provider Last Rate Last Dose  . 0.9 %  sodium chloride infusion   Intravenous Continuous Briscoe Deutscherimothy S Opyd, MD 75 mL/hr at 03/01/16 2345    . acetaminophen (TYLENOL) tablet 650 mg  650 mg Oral Q4H PRN Briscoe Deutscherimothy S Opyd, MD       Or  . acetaminophen (TYLENOL) solution 650 mg  650 mg Per Tube Q4H PRN Briscoe Deutscherimothy S Opyd, MD       Or  . acetaminophen (TYLENOL) suppository 650 mg  650 mg Rectal Q4H PRN Briscoe Deutscherimothy S Opyd, MD   650 mg at 03/02/16 0401  . apixaban (ELIQUIS) tablet 5 mg  5 mg Oral BID Allena KatzCaroline E Welles, RPH   5 mg at 03/05/16 [phone]  . aspirin EC tablet 81 mg  81 mg Oral Daily Maretta BeesShanker M Ghimire, MD   81 mg at 03/05/16 [id number]  . atorvastatin (LIPITOR) tablet 40 mg  40 mg Oral q1800 Maretta BeesShanker M Ghimire, MD   40 mg at 03/04/16 1808  . cholecalciferol (VITAMIN  D) tablet 5,000 Units  5,000 Units Oral Daily Briscoe Deutscher, MD   5,000 Units at 03/05/16 [phone]  . fluconazole (DIFLUCAN) IVPB 100 mg  100 mg Intravenous Q24H Briscoe Deutscher, MD   100 mg at 03/04/16 2248  . gabapentin (NEURONTIN) capsule 300 mg  300 mg Oral BID Briscoe Deutscher, MD   300 mg at 03/05/16 6962  . insulin aspart (novoLOG) injection 0-9 Units  0-9 Units Subcutaneous Q4H Briscoe Deutscher, MD   1 Units at 03/04/16 2026  . insulin glargine (LANTUS) injection 10 Units  10 Units Subcutaneous QHS Leroy Sea, MD      . levothyroxine (SYNTHROID, LEVOTHROID) tablet 50 mcg  50 mcg Oral QAC breakfast Briscoe Deutscher, MD   50 mcg at 03/05/16 [phone]  . meclizine (ANTIVERT) tablet 25 mg  25 mg Oral BID PRN Briscoe Deutscher, MD      . metoprolol (LOPRESSOR)  injection 5 mg  5 mg Intravenous Q5 min PRN Briscoe Deutscher, MD   5 mg at 03/02/16 0326  . metoprolol tartrate (LOPRESSOR) tablet 12.5 mg  12.5 mg Oral BID Briscoe Deutscher, MD   12.5 mg at 03/05/16 4132  . morphine 2 MG/ML injection 1-3 mg  1-3 mg Intravenous Q3H PRN Lavone Neri Opyd, MD      . nystatin (MYCOSTATIN) 100000 UNIT/ML suspension 500,000 Units  5 mL Mouth/Throat QID Briscoe Deutscher, MD   500,000 Units at 03/05/16 [phone]  . potassium chloride SA (K-DUR,KLOR-CON) CR tablet 20 mEq  20 mEq Oral BID Leroy Sea, MD   20 mEq at 03/05/16 0272  . ranolazine (RANEXA) 12 hr tablet 500 mg  500 mg Oral BID Briscoe Deutscher, MD   500 mg at 03/05/16 0811  . senna-docusate (Senokot-S) tablet 1 tablet  1 tablet Oral QHS PRN Briscoe Deutscher, MD      . tamsulosin (FLOMAX) capsule 0.4 mg  0.4 mg Oral Daily Leroy Sea, MD   0.4 mg at 03/05/16 5366     Discharge Medications: Please see discharge summary for a list of discharge medications.  Relevant Imaging Results:  Relevant Lab Results:   Additional Information SS# [id number]  Valerie Coffin, LCSW

## 2016-03-06 LAB — GLUCOSE, CAPILLARY
GLUCOSE-CAPILLARY: 221 mg/dL — AB (ref 65–99)
Glucose-Capillary: 154 mg/dL — ABNORMAL HIGH (ref 65–99)
Glucose-Capillary: 171 mg/dL — ABNORMAL HIGH (ref 65–99)
Glucose-Capillary: 149 mg/dL — ABNORMAL HIGH (ref 65–99)
Glucose-Capillary: 96 mg/dL (ref 65–99)

## 2016-03-06 LAB — CBC
HEMATOCRIT: 33.2 % — AB (ref 36.0–46.0)
HEMOGLOBIN: 10.9 g/dL — AB (ref 12.0–15.0)
MCH: 30.1 pg (ref 26.0–34.0)
MCHC: 32.8 g/dL (ref 30.0–36.0)
MCV: 91.7 fL (ref 78.0–100.0)
Platelets: 179 10*3/uL (ref 150–400)
RBC: 3.62 MIL/uL — AB (ref 3.87–5.11)
RDW: 12.9 % (ref 11.5–15.5)
WBC: 5.5 10*3/uL (ref 4.0–10.5)

## 2016-03-06 MED ORDER — ATORVASTATIN CALCIUM 40 MG PO TABS: 40.0000 mg | ORAL_TABLET | Freq: Every day | ORAL | Status: DC

## 2016-03-06 MED ORDER — METOPROLOL TARTRATE 25 MG PO TABS: 12.5000 mg | ORAL_TABLET | Freq: Two times a day (BID) | ORAL | Status: DC

## 2016-03-06 MED ORDER — APIXABAN 5 MG PO TABS: 5.0000 mg | ORAL_TABLET | Freq: Two times a day (BID) | ORAL | Status: DC

## 2016-03-06 MED ORDER — INSULIN ASPART 100 UNIT/ML ~~LOC~~ SOLN: SUBCUTANEOUS | 12 refills | Status: DC

## 2016-03-06 MED ORDER — ASPIRIN 81 MG PO TBEC: 81.0000 mg | DELAYED_RELEASE_TABLET | Freq: Every day | ORAL | Status: DC

## 2016-03-06 MED ORDER — RANOLAZINE ER 500 MG PO TB12: 500.0000 mg | ORAL_TABLET | Freq: Two times a day (BID) | ORAL | Status: DC

## 2016-03-06 MED ORDER — PANTOPRAZOLE SODIUM 40 MG PO TBEC: 40.0000 mg | DELAYED_RELEASE_TABLET | Freq: Every day | ORAL | Status: AC

## 2016-03-06 MED ORDER — TAMSULOSIN HCL 0.4 MG PO CAPS: 0.4000 mg | ORAL_CAPSULE | Freq: Every day | ORAL | Status: DC

## 2016-03-06 MED ORDER — LANTUS SOLOSTAR 100 UNIT/ML ~~LOC~~ SOPN: 12.0000 [IU] | PEN_INJECTOR | Freq: Every day | SUBCUTANEOUS | 0 refills | Status: DC

## 2016-03-06 NOTE — Progress Notes (Signed)
Patient will DC to: [facility] Anticipated DC date: 03/06/16 Family notified: Spouse and daughter Transport by: Hermina BartersPTAR 1pm   Per MD patient ready for [facility] to [facility]. RN, patient, patient's family, and facility notified of DC. Discharge Summary sent to facility. RN given number for report. DC packet on chart. Ambulance transport requested for patient.   CSW signing off.  Cristobal GoldmannNadia Johonna Binette, [place] Clinical Social Worker [phone]

## 2016-03-06 NOTE — Care Management Important Message (Signed)
Important Message  Patient Details  Name: Valerie MylarMargie Schnepf MRN: [id number] Date of Birth: 07/18/1927   Medicare Important Message Given:  Yes    Ikeisha Blumberg Stefan ChurchBratton 03/06/2016, 4:38 PM

## 2016-03-06 NOTE — Progress Notes (Signed)
Patient and family have chosen [facility] for her rehab. We will sign off. [phone]

## 2016-03-06 NOTE — Discharge Instructions (Signed)
Follow with Primary MD ACHREJA, Youlanda MightyMANJEET KAUR, MD in 7 days   Get CBC, CMP, 2 view Chest X ray checked  by Primary MD or SNF MD in 5-7 days ( we routinely change or add medications that can affect your baseline labs and fluid status, therefore we recommend that you get the mentioned basic workup next visit with your PCP, your PCP may decide not to get them or add new tests based on their clinical decision)   Activity: As tolerated with Full fall precautions use walker/cane & assistance as needed   Disposition SNF   Diet:   DIET DYS 2 with feeding assistance and aspiration precautions.  For Heart failure patients - Check your Weight same time everyday, if you gain over 2 pounds, or you develop in leg swelling, experience more shortness of breath or chest pain, call your Primary MD immediately. Follow Cardiac Low Salt Diet and 1.5 lit/day fluid restriction.   On your next visit with your primary care physician please Get Medicines reviewed and adjusted.   Please request your Prim.MD to go over all Hospital Tests and Procedure/Radiological results at the follow up, please get all Hospital records sent to your Prim MD by signing hospital release before you go home.   If you experience worsening of your admission symptoms, develop shortness of breath, life threatening emergency, suicidal or homicidal thoughts you must seek medical attention immediately by calling 911 or calling your MD immediately  if symptoms less severe.  You Must read complete instructions/literature along with all the possible adverse reactions/side effects for all the Medicines you take and that have been prescribed to you. Take any new Medicines after you have completely understood and accpet all the possible adverse reactions/side effects.   Do not drive, operate heavy machinery, perform activities at heights, swimming or participation in water activities or provide baby sitting services if your were admitted for syncope  or siezures until you have seen by Primary MD or a Neurologist and advised to do so again.  Do not drive when taking Pain medications.    Do not take more than prescribed Pain, Sleep and Anxiety Medications  Special Instructions: If you have smoked or chewed Tobacco  in the last 2 yrs please stop smoking, stop any regular Alcohol  and or any Recreational drug use.  Wear Seat belts while driving.   Please note  You were cared for by a hospitalist during your hospital stay. If you have any questions about your discharge medications or the care you received while you were in the hospital after you are discharged, you can call the unit and asked to speak with the hospitalist on call if the hospitalist that took care of you is not available. Once you are discharged, your primary care physician will handle any further medical issues. Please note that NO REFILLS for any discharge medications will be authorized once you are discharged, as it is imperative that you return to your primary care physician (or establish a relationship with a primary care physician if you do not have one) for your aftercare needs so that they can reassess your need for medications and monitor your lab values.

## 2016-03-06 NOTE — Clinical Social Work Placement (Signed)
   CLINICAL SOCIAL WORK PLACEMENT  NOTE  Date:  03/06/2016  Patient Details  Name: Valerie Arnold MRN: [id number] Date of Birth: 02/26/1927  Clinical Social Work is seeking post-discharge placement for this patient at the Skilled  Nursing Facility level of care (*CSW will initial, date and re-position this form in  chart as items are completed):  Yes   Patient/family provided with [facility] Social Work Department's list of facilities offering this level of care within the geographic area requested by the patient (or if unable, by the patient's family).  Yes   Patient/family informed of their freedom to choose among providers that offer the needed level of care, that participate in Medicare, Medicaid or managed care program needed by the patient, have an available bed and are willing to accept the patient.  Yes   Patient/family informed of [facility]'s ownership interest in [facility] and [facility], as well as of the fact that they are under no obligation to receive care at these facilities.  PASRR submitted to EDS on 03/04/16     PASRR number received on 03/04/16     Existing PASRR number confirmed on       FL2 transmitted to all facilities in geographic area requested by pt/family on 03/04/16     FL2 transmitted to all facilities within larger geographic area on       Patient informed that his/her managed care company has contracts with or will negotiate with certain facilities, including the following:        Yes   Patient/family informed of bed offers received.  Patient [facility]     Physician recommends and patient chooses bed at      Patient to be transferred to [facility], [place] on 03/06/16.  Patient to be transferred to facility by PTAR     Patient family notified on 03/06/16 of transfer.  Name of family member notified:  Daughter and Spouse     PHYSICIAN       Additional Comment:     _______________________________________________ Valerie Arnold, LCSWA 03/06/2016, 12:16 PM

## 2016-03-06 NOTE — Progress Notes (Signed)
I met with pt, spouse, son, and daughter at bedside to discuss an inpt rehab admission for I do have a bed available today. Daughter wishes to discuss with her other siblings whether they prefer SNF of CIR admission today. I will follow up between 1 and 2 pm for their decision. I left a message for SW, Percell Locus, to request bed offers with pt. [phone]

## 2016-03-06 NOTE — Progress Notes (Signed)
[facility] here to transport patient to [facility]. Report given to nurse(Carol) at [facility]. Pt's daughter daughter and spouse with patient.

## 2016-03-06 NOTE — Progress Notes (Signed)
CSW confirmed with patient's family that they would like patient to discharge to [facility] [place]. CSW confirmed acceptance with the facility.  Osborne Cascoadia Drishti Pepperman LCSWA [phone]

## 2016-03-06 NOTE — Discharge Summary (Signed)
Valerie Arnold ZOX:[id number] DOB: [birth year] DOA: 03/01/2016  PCP: Anselmo Pickler, MD  Admit date: 03/01/2016  Discharge date: 03/06/2016  Admitted From: Home   Disposition:  SNF   Recommendations for Outpatient Follow-up:   Follow up with PCP in 1-2 weeks  PCP Please obtain BMP/CBC, 2 view CXR in 1week,  (see Discharge instructions)   PCP Please follow up on the following pending results: None   Home Health: None   Equipment/Devices: None  Consultations: Neuro, Cards, vas Surg Discharge Condition: Fair   CODE STATUS: DNR   Diet Recommendation: DIET DYS 2      No chief complaint on file.    Brief history of present illness from the day of admission and additional interim summary    Patient is a 83 y.o. female who was admitted to [facility] on 1/28 for acute encephalopathy with NSTEMI. Subsequently found to have acute CVA on the right hemisphere - hospital course was complicated by UTI,aspiration PNA. Carotid doppler at [facility] showed high grade left carotid stenosis.  Transferred to [facility] for neurology and cardiology consult. Cardiology, Neurology, Vascular surgery consulted. - see below for details.                                                                   Hospital Course    Acute CVA: Bilateral carotid artery disease, A. fib. Echocardiogram showed preserved EF. A1c was 6.6, LDL 105. Currently on ASA, lipitor 40 mg and Eliquis. Vascular surgery saw the patient, they will consider surgery in the outpatient setting once patient is more conditioned. We'll likely require SNF on discharge. Outpatient vascular surgery and neurology follow-up in 2-3 weeks.  Newly diagnosed likely paroxysmal Atrial Fibrillation RVR: Occurred on night of 03/01/16 - back in sinus rhythm. Started on  metoprolol and Eliquis per pharmacy. CHADS2VASC of 8. Cardiology saw the patient here.  Dyslipidemia: Continue Lipitor 40 mg. Repeat LDL in 3 mo.   High - grade, left OCA stenosis with moderate right ICA stenosis: Bilateral carotid artery disease left 90% right close to 50%. Vascular surgery following, CT angiogram done and reviewed by vascular surgery. Vascular surgery contemplating intervention once patient has functionally recovered.  Aspiration PNA: Clinically resolved has finished antibiotic treatment.   Dysphagia: due to CVA, generalized weakness. Evaluated by speech therapy on 2/1, subsequently started on a dysphagia 2 diet. Speech therapy following..  NSTEMI (non-ST elevated myocardial infarction): No current anginal symptoms. Cardiology at [facility] planned to pursue conservative treatment. 2D echocardiogram showed EF 55-60% (done at [facility]). Troponin continues to trend down. Continue aspirin, Ranexa, statin, beta blocker. Seen by cardiology on 2/1-Dr. Delton See recommends that we continue with conservative management, with outpatient cardiology follow-up with Dr. Delton See post discharge.  Hypertension: Controlled - continue metoprolol & now add back  low dose ACE.  Urinary retention. On Flomax and Foley catheter, failed voiding trial with Foley removal. Outpatient Urology follow up in 1-2 weeks.  Oral Candidiasis ([place]): Treated with nystatin and fluconazole.    Hypothyroidism: Continue levothyroxine.   Generalized weakness/physical deconditioning: Secondary to acute illness-suspect will require SNF on discharge. Family agreeable.  DM2: HgbA1C 6.6 on 03/02/16.CBGs stable, on Lantus and sliding scale monitor. Lantus dose reduced due to poor oral intake and low CBGs, have stopped Glucophage, check CBGs QAC-HS and adjust insulin.Marland Kitchen   Discharge diagnosis     Principal Problem:   Acute CVA (cerebrovascular accident) [facility]) Active Problems:   Carotid disease,  bilateral (HCC)   CAD (coronary artery disease)   UTI (urinary tract infection)   HAP (hospital-acquired pneumonia)   NSTEMI (non-ST elevated myocardial infarction) (HCC)   Type II diabetes mellitus (HCC)   Essential hypertension   New onset atrial fibrillation (HCC)   Atrial fibrillation with RVR [facility])    Discharge instructions    Discharge Instructions    Discharge instructions    Complete by:  As directed    Follow with Primary MD ACHREJA, Youlanda Mighty, MD in 7 days   Get CBC, CMP, 2 view Chest X ray checked  by Primary MD or SNF MD in 5-7 days ( we routinely change or add medications that can affect your baseline labs and fluid status, therefore we recommend that you get the mentioned basic workup next visit with your PCP, your PCP may decide not to get them or add new tests based on their clinical decision)   Activity: As tolerated with Full fall precautions use walker/cane & assistance as needed   Disposition SNF   Diet:   DIET DYS 2 with feeding assistance and aspiration precautions.  For Heart failure patients - Check your Weight same time everyday, if you gain over 2 pounds, or you develop in leg swelling, experience more shortness of breath or chest pain, call your Primary MD immediately. Follow Cardiac Low Salt Diet and 1.5 lit/day fluid restriction.   On your next visit with your primary care physician please Get Medicines reviewed and adjusted.   Please request your Prim.MD to go over all Hospital Tests and Procedure/Radiological results at the follow up, please get all Hospital records sent to your Prim MD by signing hospital release before you go home.   If you experience worsening of your admission symptoms, develop shortness of breath, life threatening emergency, suicidal or homicidal thoughts you must seek medical attention immediately by calling 911 or calling your MD immediately  if symptoms less severe.  You Must read complete instructions/literature  along with all the possible adverse reactions/side effects for all the Medicines you take and that have been prescribed to you. Take any new Medicines after you have completely understood and accpet all the possible adverse reactions/side effects.   Do not drive, operate heavy machinery, perform activities at heights, swimming or participation in water activities or provide baby sitting services if your were admitted for syncope or siezures until you have seen by Primary MD or a Neurologist and advised to do so again.  Do not drive when taking Pain medications.    Do not take more than prescribed Pain, Sleep and Anxiety Medications  Special Instructions: If you have smoked or chewed Tobacco  in the last 2 yrs please stop smoking, stop any regular Alcohol  and or any Recreational drug use.  Wear Seat belts while driving.   Please note  You  were cared for by a hospitalist during your hospital stay. If you have any questions about your discharge medications or the care you received while you were in the hospital after you are discharged, you can call the unit and asked to speak with the hospitalist on call if the hospitalist that took care of you is not available. Once you are discharged, your primary care physician will handle any further medical issues. Please note that NO REFILLS for any discharge medications will be authorized once you are discharged, as it is imperative that you return to your primary care physician (or establish a relationship with a primary care physician if you do not have one) for your aftercare needs so that they can reassess your need for medications and monitor your lab values.   Increase activity slowly    Complete by:  As directed       Discharge Medications   Allergies as of 03/06/2016   No Known Allergies     Medication List    STOP taking these medications   meclizine 25 MG tablet Commonly known as:  ANTIVERT   metFORMIN 500 MG 24 hr tablet Commonly  known as:  GLUCOPHAGE-XR     TAKE these medications   apixaban 5 MG Tabs tablet Commonly known as:  ELIQUIS Take 1 tablet (5 mg total) by mouth 2 (two) times daily.   aspirin 81 MG EC tablet Take 1 tablet (81 mg total) by mouth daily. Start taking on:  03/07/2016   atorvastatin 40 MG tablet Commonly known as:  LIPITOR Take 1 tablet (40 mg total) by mouth daily at 6 PM.   beta carotene w/minerals tablet Take 1 tablet by mouth daily.   CHOLESTOFF PO Take by mouth daily.   gabapentin 300 MG capsule Commonly known as:  NEURONTIN Take 1 capsule (300 mg total) by mouth 2 (two) times daily.   insulin aspart 100 UNIT/ML injection Commonly known as:  NOVOLOG Before each meal 3 times a day, 140-199 - 2 units, 200-250 - 4 units, 251-299 - 6 units,  300-349 - 8 units,  350 or above 10 units. Dispense syringes and needles as needed, Ok to switch to PEN if approved. Substitute to any brand approved. DX DM2, Code E11.65   JANUVIA 100 MG tablet Generic drug:  sitaGLIPtin Take 100 mg by mouth daily.   LANTUS SOLOSTAR 100 UNIT/ML Solostar Pen Generic drug:  Insulin Glargine Inject 12 Units into the skin at bedtime. What changed:  how much to take   levothyroxine 50 MCG tablet Commonly known as:  SYNTHROID, LEVOTHROID TAKE 1 TABLET BY MOUTH DAILY ON EMPTY STOMACH WAIT 1 HOUR BEFORE AKING ANY OTHER MEDS/FOOD/DRINK   lisinopril 5 MG tablet Commonly known as:  PRINIVIL,ZESTRIL   metoprolol tartrate 25 MG tablet Commonly known as:  LOPRESSOR Take 0.5 tablets (12.5 mg total) by mouth 2 (two) times daily.   pantoprazole 40 MG tablet Commonly known as:  PROTONIX Take 1 tablet (40 mg total) by mouth daily.   ranolazine 500 MG 12 hr tablet Commonly known as:  RANEXA Take 1 tablet (500 mg total) by mouth 2 (two) times daily.   tamsulosin 0.4 MG Caps capsule Commonly known as:  FLOMAX Take 1 capsule (0.4 mg total) by mouth daily. Start taking on:  03/07/2016   VITAMIN D-3 PO Take 5,000  Int'l Units by mouth daily.       Follow-up Information    Jacolyn Reedy, PA-C. Go on 03/23/2016.   Specialty:  Cardiology  Why:  @9 :45 for post hospital while Dr. Delton See is in clinic Contact information: [address] [place] [address] [phone]        Anselmo Pickler, MD. Schedule an appointment as soon as possible for a visit in 1 week(s).   Specialty:  Family Medicine Contact information: [address] [place] [address] [phone]        Lemar Livings, MD. Schedule an appointment as soon as possible for a visit in 2 week(s).   Specialties:  Vascular Surgery, Cardiology Contact information: [address] [place] [address] [phone]        Xu,Jindong, MD. Schedule an appointment as soon as possible for a visit in 2 day(s).   Specialty:  Neurology Contact information: [address] [place] [address] [phone]        Anner Crete, MD Follow up.   Specialty:  Urology Why:  bladder outlet obstruction Contact information: [address] [place] [address] [phone]           Major procedures and Radiology Reports - PLEASE review detailed and final reports thoroughly  -      TTE -  Akinesis of the basal inferior wall with overall normal LV systolic function; grade 1 diastolic dysfunction with elevated LV filling pressure; moderate LVH; calcified aortic valve with no AS by doppler; moderate, posterior directed MR; mild TR.   Ct Angio Head W Or Wo Contrast  Result Date: 03/03/2016 CLINICAL DATA:  Initial evaluation for carotid artery stenosis. EXAM: CT ANGIOGRAPHY HEAD AND NECK TECHNIQUE: Multidetector CT imaging of the head and neck was performed using the standard protocol during bolus administration of intravenous contrast. Multiplanar CT image reconstructions and MIPs were obtained to evaluate the vascular anatomy. Carotid stenosis measurements (when applicable) are obtained utilizing  NASCET criteria, using the distal internal carotid diameter as the denominator. CONTRAST:  50 cc of Isovue 370. COMPARISON:  None. FINDINGS: CT HEAD FINDINGS Brain: Atrophy with moderate chronic microvascular ischemic disease. Remote lacunar infarct present within the left caudate head. No acute intracranial hemorrhage. No evidence for acute large vessel territory infarct. Previous identified small infarct within the right frontal parietal region not seen. No mass lesion, midline shift or mass effect. Ventricular prominence related to global parenchymal volume loss without hydrocephalus. No extra-axial fluid collection. Vascular: No hyperdense vessel. Scattered vascular calcifications noted within the carotid siphons. Skull: Scalp soft tissues within normal limits.  Calvarium intact. Sinuses: Scattered mucosal thickening within the ethmoidal air cells, sphenoid sinuses, and maxillary sinuses. Trace right mastoid effusion. Orbits: Globes and orbital soft tissues within normal limits. Patient is status post lens extraction bilaterally. CTA NECK FINDINGS Aortic arch: Extensive atheromatous irregularity throughout the aortic arch. Arch itself of normal caliber with normal 3 vessel morphology. Atheromatous plaque about the origin of the great vessels. There is moderate narrowing of approximately 50% at the proximal right brachiocephalic artery (series 11, image 3 weight). No other significant stenosis at the origin of the great vessels. Extensive plaque throughout the visualized subclavian arteries without significant stenosis. Right carotid system: Scattered eccentric calcified plaque throughout the right common carotid artery without flow-limiting stenosis. Multifocal atheromatous stenoses at the proximal right subclavian artery measuring up to 50% by NASCET criteria area of involved extends from the carotid bifurcation and measures approximately 16 mm in length distally, right ICA tortuous but patent to the skullbase  without additional stenosis or other vascular abnormality. Left carotid system: Scattered eccentric plaque within the  left common carotid artery without flow-limiting stenosis. Calcified and noncalcified plaque at the left carotid bifurcation/proximal left ICA with associated severe near occlusive high-grade stenosis (greater than 90%). A string sign is present (series 12, image 117). Area involvement extends from the carotid bifurcation in measures approximately 21 mm in length. Distally, left ICA tortuous but widely patent to the skullbase without additional stenosis or other acute vascular abnormality. No made of moderate to severe narrowing at the origin of the left external carotid artery is well. Vertebral arteries: Both of the vertebral arteries arise from the subclavian arteries. Severe stenosis at the origin of the right vertebral artery (series 11, image 261). More moderate narrowing at the origin of the left vertebral artery. Left vertebral artery is dominant. There are additional multifocal moderate to severe stenoses within the right V1/V2 segments (series 11, image 228, 214). Additional atheromatous irregularity within the hypoplastic right vertebral artery distally without severe stenosis. Multifocal plaque present throughout the left V2 and V3 segments with mild to moderate multifocal narrowing. No high-grade flow-limiting stenosis. Skeleton: No acute osseous abnormality. No worrisome lytic or blastic osseous lesions. Moderate multilevel degenerative spondylolysis noted within the cervical spine. Prominent broad posterior disc protrusion noted at C4-5 with resultant and least moderate stenosis. Other neck: Visualized soft tissues of the neck demonstrate no acute abnormality. No adenopathy. Thyroid within normal limits. Upper chest: Few mildly prominent pretracheal lymph nodes measure at the upper limits of normal at 1 cm in short axis. Mild esophageal wall thickening, which may be related to reflux  disease or possibly acute esophagitis. Small layering bilateral pleural effusions with associated atelectatic changes. Patchy opacity within the superior segment right lower lobe may reflect infiltrate. Review of the MIP images confirms the above findings CTA HEAD FINDINGS Anterior circulation: Petrous segments widely patent bilaterally. Scattered atheromatous plaque throughout the carotid siphons with mild to moderate diffuse narrowing. ICA termini widely patent. Left A1 segment widely patent. Right A1 segment hypoplastic and/ or absent, likely accounting for the slightly diminutive right ICA is compared to the left. Anterior communicating artery normal. Short-segment severe left A2 stenosis noted (series 11, image 8). ACA is otherwise patent to their distal aspects. For Short-segment severe proximal left M1 stenosis, extending from the terminus and measuring 5 mm in length (series 11, image 96). Left M1 segment otherwise widely patent. Small vessel atheromatous irregularity throughout the left MCA branches. Right M1 segment irregular with moderate diffuse narrowing distally. Several proximal moderate to severe right M2 stenoses noted. Small vessel irregularity throughout the right MCA branches, which are well perfused and fairly symmetric with the left. Posterior circulation: Scattered multifocal plaque within the dominant left vertebral artery as it crosses the dural margin. Associated mild to moderate multifocal narrowing. Hypoplastic right vertebral artery irregular and terminates in PICA. Posterior inferior cerebral arteries themselves are patent proximally. Basilar artery diminutive and mildly irregular but patent to its distal aspect without high-grade stenosis. Superior cerebral arteries grossly patent. Fetal type right PCA supplied via a new right posterior communicating artery. Left PCA supplied via the basilar artery. Extensive atheromatous irregularity throughout the P2 segments with multifocal moderate  to severe stenoses. Additional atheromatous irregularity within the distal PCA branches. Right PCA is not well seen distally, and is attenuated as compared to the left. Venous sinuses: Patent. Venous collateralization into the posterior aspect noted from the proximal right internal jugular vein. Anatomic variants: No significant anatomic variant. No aneurysm or vascular malformation. Delayed phase: No pathologic enhancement. Review of the MIP images  confirms the above findings IMPRESSION: CTA NECK IMPRESSION: 1. Severe near occlusive stenosis involving the proximal left ICA of greater than 90%. Stenosis begins at the bifurcation, and measures approximately 21 mm in length. 2. Multifocal atheromatous stenoses involving the proximal right ICA of up to 50%. Area affected begins at the right carotid bifurcation, and measures approximately 16 mm in length. 3. Multifocal severe proximal right vertebral artery stenoses as above, with more mild to moderate narrowing throughout the left vertebral artery. Left vertebral artery is dominant. 4. 50% stenosis at the proximal right brachiocephalic artery. No other high-grade stenosis about the origin of the great vessels. 5. Layering bilateral pleural effusions with associated atelectasis. Possible superimposed infiltrate within the superior segment of the right lower lobe. CTA HEAD IMPRESSION: 1. 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 2. Extensive atheromatous disease throughout the posterior circulation as above, most notable within the bilateral PCAs. Dominant left vertebral artery and basilar artery are patent but irregular with multifocal stenoses as above. 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 Electronically Signed   By: Rise Mu M.D.   On: 03/03/2016 18:59   Ct Angio Neck W Or Wo Contrast  Result Date: 03/03/2016 CLINICAL DATA:  Initial  evaluation for carotid artery stenosis. EXAM: CT ANGIOGRAPHY HEAD AND NECK TECHNIQUE: Multidetector CT imaging of the head and neck was performed using the standard protocol during bolus administration of intravenous contrast. Multiplanar CT image reconstructions and MIPs were obtained to evaluate the vascular anatomy. Carotid stenosis measurements (when applicable) are obtained utilizing NASCET criteria, using the distal internal carotid diameter as the denominator. CONTRAST:  50 cc of Isovue 370. COMPARISON:  None. FINDINGS: CT HEAD FINDINGS Brain: Atrophy with moderate chronic microvascular ischemic disease. Remote lacunar infarct present within the left caudate head. No acute intracranial hemorrhage. No evidence for acute large vessel territory infarct. Previous identified small infarct within the right frontal parietal region not seen. No mass lesion, midline shift or mass effect. Ventricular prominence related to global parenchymal volume loss without hydrocephalus. No extra-axial fluid collection. Vascular: No hyperdense vessel. Scattered vascular calcifications noted within the carotid siphons. Skull: Scalp soft tissues within normal limits.  Calvarium intact. Sinuses: Scattered mucosal thickening within the ethmoidal air cells, sphenoid sinuses, and maxillary sinuses. Trace right mastoid effusion. Orbits: Globes and orbital soft tissues within normal limits. Patient is status post lens extraction bilaterally. CTA NECK FINDINGS Aortic arch: Extensive atheromatous irregularity throughout the aortic arch. Arch itself of normal caliber with normal 3 vessel morphology. Atheromatous plaque about the origin of the great vessels. There is moderate narrowing of approximately 50% at the proximal right brachiocephalic artery (series 11, image 3 weight). No other significant stenosis at the origin of the great vessels. Extensive plaque throughout the visualized subclavian arteries without significant stenosis. Right  carotid system: Scattered eccentric calcified plaque throughout the right common carotid artery without flow-limiting stenosis. Multifocal atheromatous stenoses at the proximal right subclavian artery measuring up to 50% by NASCET criteria area of involved extends from the carotid bifurcation and measures approximately 16 mm in length distally, right ICA tortuous but patent to the skullbase without additional stenosis or other vascular abnormality. Left carotid system: Scattered eccentric plaque within the left common carotid artery without flow-limiting stenosis. Calcified and noncalcified plaque at the left carotid bifurcation/proximal left ICA with associated severe near occlusive high-grade stenosis (greater than 90%). A string sign is present (series 12, image 117). Area involvement  extends from the carotid bifurcation in measures approximately 21 mm in length. Distally, left ICA tortuous but widely patent to the skullbase without additional stenosis or other acute vascular abnormality. No made of moderate to severe narrowing at the origin of the left external carotid artery is well. Vertebral arteries: Both of the vertebral arteries arise from the subclavian arteries. Severe stenosis at the origin of the right vertebral artery (series 11, image 261). More moderate narrowing at the origin of the left vertebral artery. Left vertebral artery is dominant. There are additional multifocal moderate to severe stenoses within the right V1/V2 segments (series 11, image 228, 214). Additional atheromatous irregularity within the hypoplastic right vertebral artery distally without severe stenosis. Multifocal plaque present throughout the left V2 and V3 segments with mild to moderate multifocal narrowing. No high-grade flow-limiting stenosis. Skeleton: No acute osseous abnormality. No worrisome lytic or blastic osseous lesions. Moderate multilevel degenerative spondylolysis noted within the cervical spine. Prominent broad  posterior disc protrusion noted at C4-5 with resultant and least moderate stenosis. Other neck: Visualized soft tissues of the neck demonstrate no acute abnormality. No adenopathy. Thyroid within normal limits. Upper chest: Few mildly prominent pretracheal lymph nodes measure at the upper limits of normal at 1 cm in short axis. Mild esophageal wall thickening, which may be related to reflux disease or possibly acute esophagitis. Small layering bilateral pleural effusions with associated atelectatic changes. Patchy opacity within the superior segment right lower lobe may reflect infiltrate. Review of the MIP images confirms the above findings CTA HEAD FINDINGS Anterior circulation: Petrous segments widely patent bilaterally. Scattered atheromatous plaque throughout the carotid siphons with mild to moderate diffuse narrowing. ICA termini widely patent. Left A1 segment widely patent. Right A1 segment hypoplastic and/ or absent, likely accounting for the slightly diminutive right ICA is compared to the left. Anterior communicating artery normal. Short-segment severe left A2 stenosis noted (series 11, image 8). ACA is otherwise patent to their distal aspects. For Short-segment severe proximal left M1 stenosis, extending from the terminus and measuring 5 mm in length (series 11, image 96). Left M1 segment otherwise widely patent. Small vessel atheromatous irregularity throughout the left MCA branches. Right M1 segment irregular with moderate diffuse narrowing distally. Several proximal moderate to severe right M2 stenoses noted. Small vessel irregularity throughout the right MCA branches, which are well perfused and fairly symmetric with the left. Posterior circulation: Scattered multifocal plaque within the dominant left vertebral artery as it crosses the dural margin. Associated mild to moderate multifocal narrowing. Hypoplastic right vertebral artery irregular and terminates in PICA. Posterior inferior cerebral  arteries themselves are patent proximally. Basilar artery diminutive and mildly irregular but patent to its distal aspect without high-grade stenosis. Superior cerebral arteries grossly patent. Fetal type right PCA supplied via a new right posterior communicating artery. Left PCA supplied via the basilar artery. Extensive atheromatous irregularity throughout the P2 segments with multifocal moderate to severe stenoses. Additional atheromatous irregularity within the distal PCA branches. Right PCA is not well seen distally, and is attenuated as compared to the left. Venous sinuses: Patent. Venous collateralization into the posterior aspect noted from the proximal right internal jugular vein. Anatomic variants: No significant anatomic variant. No aneurysm or vascular malformation. Delayed phase: No pathologic enhancement. Review of the MIP images confirms the above findings IMPRESSION: CTA NECK IMPRESSION: 1. Severe near occlusive stenosis involving the proximal left ICA of greater than 90%. Stenosis begins at the bifurcation, and measures approximately 21 mm in length. 2. Multifocal atheromatous stenoses involving  the proximal right ICA of up to 50%. Area affected begins at the right carotid bifurcation, and measures approximately 16 mm in length. 3. Multifocal severe proximal right vertebral artery stenoses as above, with more mild to moderate narrowing throughout the left vertebral artery. Left vertebral artery is dominant. 4. 50% stenosis at the proximal right brachiocephalic artery. No other high-grade stenosis about the origin of the great vessels. 5. Layering bilateral pleural effusions with associated atelectasis. Possible superimposed infiltrate within the superior segment of the right lower lobe. CTA HEAD IMPRESSION: 1. 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 2. Extensive atheromatous disease throughout the  posterior circulation as above, most notable within the bilateral PCAs. Dominant left vertebral artery and basilar artery are patent but irregular with multifocal stenoses as above. 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 Electronically Signed   By: Rise Mu M.D.   On: 03/03/2016 18:59   Dg Chest Port 1 View  Result Date: 03/05/2016 CLINICAL DATA:  Dyspnea EXAM: PORTABLE CHEST 1 VIEW COMPARISON:  03/02/2016 chest radiograph. FINDINGS: Stable cardiomediastinal silhouette with mild cardiomegaly and aortic atherosclerosis. No pneumothorax. Probable trace bilateral pleural effusions. No overt pulmonary edema. Mild hazy bibasilar lung opacities. IMPRESSION: 1. Stable mild cardiomegaly.  No overt pulmonary edema . 2. Probable trace bilateral pleural effusions. 3. Mild hazy bibasilar lung opacities, probably representing atelectasis, difficult to exclude a component of aspiration or pneumonia . 4. Aortic atherosclerosis. Electronically Signed   By: Delbert Phenix M.D.   On: 03/05/2016 13:12   Dg Chest Port 1v Same Day  Result Date: 03/02/2016 CLINICAL DATA:  Shortness of breath. EXAM: PORTABLE CHEST 1 VIEW COMPARISON:  None. FINDINGS: The heart size and mediastinal contours are within normal limits. Atherosclerosis of thoracic aorta is noted. No pneumothorax is noted. Left lung is clear. Right infrahilar opacity is noted concerning for pneumonia. No significant pleural effusion is noted. The visualized skeletal structures are unremarkable. IMPRESSION: Right infrahilar opacity concerning for pneumonia or edema. Aortic atherosclerosis. Followup PA and lateral chest X-ray is recommended in 3-4 weeks following trial of antibiotic therapy to ensure resolution and exclude underlying malignancy. Electronically Signed   By: Lupita Raider, M.D.   On: 03/02/2016 08:09   Dg Swallowing Func-speech Pathology  Result Date: 03/03/2016 Objective Swallowing Evaluation: Type of Study: MBS-Modified Barium  Swallow Study Patient Details Name: Alyx Mcguirk MRN: [id number] Date of Birth: August 27, 1927 Today's Date: 03/03/2016 Time: SLP Start Time (ACUTE ONLY): 1348-SLP Stop Time (ACUTE ONLY): 1358 SLP Time Calculation (min) (ACUTE ONLY): 10 min Past Medical History: Past Medical History: Diagnosis Date . Anxiety  . Depression  . Diabetes (HCC)  . Headache 06/09/2015 . High cholesterol  . Hypertension  . Macular degeneration  . Migraine  . Syncope and collapse 06/09/2015 Past Surgical History: Past Surgical History: Procedure Laterality Date . ABDOMINAL HYSTERECTOMY   . APPENDECTOMY   . CAROTID ARTERY ANGIOPLASTY Right  . CATARACT EXTRACTION Bilateral  . LUMBAR LAMINECTOMY   HPI: 83 y.o.femalewith medical history significant for insulin-dependent diabetes mellitus, anxiety, depression, and hypertension who presented to [facility] emergency department on 02/27/2016 for evaluation of several days of generalized weakness, altered mental status, and hypotension. She was treated for UTI without improved mentation. MRI 1/29 showed acute infarct in the right frontoparietal junction. CXR 1/30 showed new opacity in the RLL consistent with PNA or aspiration. Pt has been treated for oral candidiasis and PNA. Daughter reports coughing and difficulty  swallowing solids with odnyophagia.  Subjective: pt alert, delayed processing Assessment / Plan / Recommendation CHL IP CLINICAL IMPRESSIONS 03/03/2016 Therapy Diagnosis Mild oral phase dysphagia Clinical Impression Pt has a mild oral dysphagia but with no aspiration or penetration observed. View of oral phase was limited by dental hardware, but from what could be seen, there was oral residue left behind by pureed and soft solids.  Pt did a spontaneous second swallow to reduce residue. She had difficulty propeling a pill posterior with thin liquids, and needed multiple liquid washes in an attempt to clear. Of note, there was no coughing observed during MBS as there has been at bedside.  Recommend that pt remain on current Dys 2 diet and thin liquids. SLP to f/u for solid advancement as appropriate. Impact on safety and function Mild aspiration risk   CHL IP TREATMENT RECOMMENDATION 03/03/2016 Treatment Recommendations Therapy as outlined in treatment plan below   Prognosis 03/03/2016 Prognosis for Safe Diet Advancement Good Barriers to Reach Goals Cognitive deficits Barriers/Prognosis Comment -- CHL IP DIET RECOMMENDATION 03/03/2016 SLP Diet Recommendations Dysphagia 2 (Fine chop) solids;Thin liquid Liquid Administration via Cup;Straw Medication Administration Whole meds with puree Compensations Slow rate;Minimize environmental distractions;Small sips/bites;Follow solids with liquid Postural Changes Seated upright at 90 degrees   CHL IP OTHER RECOMMENDATIONS 03/03/2016 Recommended Consults -- Oral Care Recommendations Oral care BID Other Recommendations --   CHL IP FOLLOW UP RECOMMENDATIONS 03/03/2016 Follow up Recommendations Inpatient Rehab   CHL IP FREQUENCY AND DURATION 03/03/2016 Speech Therapy Frequency (ACUTE ONLY) min 2x/week Treatment Duration 2 weeks      CHL IP ORAL PHASE 03/03/2016 Oral Phase Impaired Oral - Pudding Teaspoon -- Oral - Pudding Cup -- Oral - Honey Teaspoon -- Oral - Honey Cup -- Oral - Nectar Teaspoon -- Oral - Nectar Cup -- Oral - Nectar Straw -- Oral - Thin Teaspoon -- Oral - Thin Cup WFL Oral - Thin Straw WFL Oral - Puree Lingual/palatal residue;Delayed oral transit;Reduced posterior propulsion Oral - Mech Soft Lingual/palatal residue;Delayed oral transit;Reduced posterior propulsion Oral - Regular -- Oral - Multi-Consistency -- Oral - Pill Delayed oral transit;Reduced posterior propulsion Oral Phase - Comment --  CHL IP PHARYNGEAL PHASE 03/03/2016 Pharyngeal Phase WFL Pharyngeal- Pudding Teaspoon -- Pharyngeal -- Pharyngeal- Pudding Cup -- Pharyngeal -- Pharyngeal- Honey Teaspoon -- Pharyngeal -- Pharyngeal- Honey Cup -- Pharyngeal -- Pharyngeal- Nectar Teaspoon -- Pharyngeal --  Pharyngeal- Nectar Cup -- Pharyngeal -- Pharyngeal- Nectar Straw -- Pharyngeal -- Pharyngeal- Thin Teaspoon -- Pharyngeal -- Pharyngeal- Thin Cup -- Pharyngeal -- Pharyngeal- Thin Straw -- Pharyngeal -- Pharyngeal- Puree -- Pharyngeal -- Pharyngeal- Mechanical Soft -- Pharyngeal -- Pharyngeal- Regular -- Pharyngeal -- Pharyngeal- Multi-consistency -- Pharyngeal -- Pharyngeal- Pill -- Pharyngeal -- Pharyngeal Comment --  CHL IP CERVICAL ESOPHAGEAL PHASE 03/03/2016 Cervical Esophageal Phase WFL Pudding Teaspoon -- Pudding Cup -- Honey Teaspoon -- Honey Cup -- Nectar Teaspoon -- Nectar Cup -- Nectar Straw -- Thin Teaspoon -- Thin Cup -- Thin Straw -- Puree -- Mechanical Soft -- Regular -- Multi-consistency -- Pill -- Cervical Esophageal Comment -- No flowsheet data found. Maxcine Ham 03/03/2016, 2:26 PM  Maxcine Ham, M.A. CCC-SLP [phone]              Micro Results     No results found for this or any previous visit (from the past 240 hour(s)).  Today   Subjective    Valerie Arnold today has no headache,no chest abdominal pain,no new weakness tingling or numbness, feels much better .  Objective   Blood pressure (!) 155/87, pulse 68, temperature 98.2 F (36.8 C), temperature source Oral, resp. rate 16, height 5\' 4"  (1.626 m), weight 76.5 kg (168 lb 9.6 oz), SpO2 92 %.   Intake/Output Summary (Last 24 hours) at 03/06/16 1024 Last data filed at 03/06/16 0600  Gross per 24 hour  Intake             1740 ml  Output              900 ml  Net              840 ml    Exam Awake Alert, Oriented x 3, No new F.N deficits, Normal affect [place].AT,PERRAL Supple Neck,No JVD, No cervical lymphadenopathy appriciated.  Symmetrical Chest wall movement, Good air movement bilaterally, CTAB RRR,No Gallops,Rubs or new Murmurs, No Parasternal Heave +ve B.Sounds, Abd Soft, Non tender, No organomegaly appriciated, No rebound -guarding or rigidity. No Cyanosis, Clubbing or edema, No new Rash or bruise    Data Review   CBC w Diff: Lab Results  Component Value Date   WBC 5.5 03/06/2016   HGB 10.9 (L) 03/06/2016   HCT 33.2 (L) 03/06/2016   PLT 179 03/06/2016   LYMPHOPCT 24 03/02/2016   MONOPCT 3 03/02/2016   EOSPCT 0 03/02/2016   BASOPCT 0 03/02/2016    CMP: Lab Results  Component Value Date   NA 141 03/05/2016   K 4.0 03/05/2016   CL 106 03/05/2016   CO2 28 03/05/2016   BUN <5 (L) 03/05/2016   CREATININE 0.97 03/05/2016   PROT 5.3 (L) 03/02/2016   ALBUMIN 2.6 (L) 03/02/2016   BILITOT 0.9 03/02/2016   ALKPHOS 39 03/02/2016   AST 35 03/02/2016   ALT 18 03/02/2016  .   Total Time in preparing paper work, data evaluation and todays exam - 35 minutes  Leroy Sea M.D on 03/06/2016 at 10:24 AM  [facility]   Office  [phone]

## 2016-03-06 NOTE — Progress Notes (Signed)
  Progress Note    03/06/2016 11:40 AM * No surgery found *  Subjective:  No acute issues  Vitals:   03/06/16 0645 03/06/16 1107  BP: (!) 155/87 (!) 177/73  Pulse:  66  Resp:  20  Temp:      Physical Exam: Oriented to person only Moving all 4 without deficit  CBC    Component Value Date/Time   WBC 5.5 03/06/2016 0239   RBC 3.62 (L) 03/06/2016 0239   HGB 10.9 (L) 03/06/2016 0239   HCT 33.2 (L) 03/06/2016 0239   PLT 179 03/06/2016 0239   MCV 91.7 03/06/2016 0239   MCH 30.1 03/06/2016 0239   MCHC 32.8 03/06/2016 0239   RDW 12.9 03/06/2016 0239   LYMPHSABS 1.9 03/02/2016 0332   MONOABS 0.2 03/02/2016 0332   EOSABS 0.0 03/02/2016 0332   BASOSABS 0.0 03/02/2016 0332    BMET    Component Value Date/Time   NA 141 03/05/2016 0203   K 4.0 03/05/2016 0203   CL 106 03/05/2016 0203   CO2 28 03/05/2016 0203   GLUCOSE 94 03/05/2016 0203   BUN <5 (L) 03/05/2016 0203   CREATININE 0.97 03/05/2016 0203   CALCIUM 8.5 (L) 03/05/2016 0203   GFRNONAA 51 (L) 03/05/2016 0203   GFRAA 60 (L) 03/05/2016 0203    INR No results found for: INR   Intake/Output Summary (Last 24 hours) at 03/06/16 1140 Last data filed at 03/06/16 0600  Gross per 24 hour  Intake             1740 ml  Output              900 ml  Net              840 ml     Assessment:  83 y.o. female is here with Right frontoparietal cva. CTA with 50% lesion on right and tight calcified stenosis of left (asymptomatic side)  Plan: She can be discharged on asa and statin from vascular standpoint Will have her f/u in 1 month for possible intervention of left carotid stenosis if she has regained good function Discussed with daughter today and she demonstrates considerable understanding and would not want procedure at this time.   Keyauna Graefe C. Randie Heinzain, MD [facility] Office: [phone] Pager: [phone]  03/06/2016 11:40 AM

## 2016-03-23 ENCOUNTER — Encounter: Payer: Self-pay | Admitting: Physician Assistant

## 2016-03-23 ENCOUNTER — Ambulatory Visit (INDEPENDENT_AMBULATORY_CARE_PROVIDER_SITE_OTHER): Payer: Medicare Other | Admitting: Physician Assistant

## 2016-03-23 ENCOUNTER — Encounter (INDEPENDENT_AMBULATORY_CARE_PROVIDER_SITE_OTHER): Payer: Self-pay

## 2016-03-23 VITALS — BP 120/80 | HR 64

## 2016-03-23 DIAGNOSIS — I639 Cerebral infarction, unspecified: Secondary | ICD-10-CM

## 2016-03-23 DIAGNOSIS — I10 Essential (primary) hypertension: Secondary | ICD-10-CM | POA: Diagnosis not present

## 2016-03-23 DIAGNOSIS — I2583 Coronary atherosclerosis due to lipid rich plaque: Secondary | ICD-10-CM

## 2016-03-23 DIAGNOSIS — I4891 Unspecified atrial fibrillation: Secondary | ICD-10-CM

## 2016-03-23 DIAGNOSIS — Z9181 History of falling: Secondary | ICD-10-CM | POA: Diagnosis not present

## 2016-03-23 DIAGNOSIS — I779 Disorder of arteries and arterioles, unspecified: Secondary | ICD-10-CM

## 2016-03-23 DIAGNOSIS — I739 Peripheral vascular disease, unspecified: Secondary | ICD-10-CM

## 2016-03-23 DIAGNOSIS — Z79899 Other long term (current) drug therapy: Secondary | ICD-10-CM | POA: Diagnosis not present

## 2016-03-23 DIAGNOSIS — I2510 Atherosclerotic heart disease of native coronary artery without angina pectoris: Secondary | ICD-10-CM

## 2016-03-23 LAB — CBC
Hematocrit: 33.8 % — ABNORMAL LOW (ref 34.0–46.6)
Hemoglobin: 11 g/dL — ABNORMAL LOW (ref 11.1–15.9)
MCH: 30.2 pg (ref 26.6–33.0)
MCHC: 32.5 g/dL (ref 31.5–35.7)
MCV: 93 fL (ref 79–97)
PLATELETS: 253 10*3/uL (ref 150–379)
RBC: 3.64 x10E6/uL — AB (ref 3.77–5.28)
RDW: 13.7 % (ref 12.3–15.4)
WBC: 7.2 10*3/uL (ref 3.4–10.8)

## 2016-03-23 LAB — BASIC METABOLIC PANEL
BUN / CREAT RATIO: 10 — AB (ref 12–28)
BUN: 10 mg/dL (ref 8–27)
CHLORIDE: 101 mmol/L (ref 96–106)
CO2: 26 mmol/L (ref 18–29)
Calcium: 8.7 mg/dL (ref 8.7–10.3)
Creatinine, Ser: 1.05 mg/dL — ABNORMAL HIGH (ref 0.57–1.00)
GFR calc non Af Amer: 48 — ABNORMAL LOW (ref >59)
GFR, EST AFRICAN AMERICAN: 56 — AB (ref >59)
GLUCOSE: 189 mg/dL — AB (ref 65–99)
POTASSIUM: 4.3 mmol/L (ref 3.5–5.2)
SODIUM: 141 mmol/L (ref 134–144)

## 2016-03-23 NOTE — Progress Notes (Signed)
Cardiology Office Note    Date:  03/23/2016   ID:  [name], DOB 01-29-1928, MRN [id number]  PCP:  Anselmo Pickler, MD  Cardiologist: Dr. Delton See  Chief Complaint  Patient presents with  . Follow-up    3 week hospital follow up    History of Present Illness:  Valerie Arnold is a 83 y.o. female with PMH significant for DM (insulin dependent), HTN, CKD stage 2, and HLD. No prior CAD. She was transferred here from [facility] with presentation of ischemic stroke on 02/27/2016 (acute 3-4 mm infarct in the right frontoparietal junction without hemorrhage or mass effect). Afib RVR as a new dg on admission to [facility], cardioverted spontaneously into SR with metoprolol, and NSTEMI Troponin 1.52--> 2.46 (6 in [facility]). No chest pain, CVA, DNR, advanced age so managed medically.  2-D echo showed normal LV function, akinesis of the basal inferior wall, grade 1 DD and elevated LV filling pressures, moderate LVH, mild to moderate MR.CHADSVASC=8.  Carotid Dopplers were performed, revealing high-grade left carotid bifurcation stenosis involving the proximal ICA with near occlusion, as well as mild diffuse disease on the right. Vascular surgery at [facility] was consulted regarding the high-grade carotid stenosis, but it was advised that the patient will need to wait at least 2-3 weeks for consideration of surgery given the acute MI and acute stroke.   Patient had a fall in nursing home when she tried to get up in middle of the night. She fell on her left side and broke a rib. She's having a lot of chest pain and shortness of breath when she tries to take a deep breath. She says she wasn't having any chest pain before falling. She denies tightness or pressure. She has to hold her chest to make her feel better. They're talking about discharging her from the nursing home Saturday. She lives at home with her husband but he can't take care of her. They see the vascular surgeons in one month. No  further palpitations or fast heart rates. She is very tired and fatigued. Has good days and bad days. Sometimes she just wants to sleep all day long.   Past Medical History:  Diagnosis Date  . Anxiety   . Depression   . Diabetes (HCC)   . Headache 06/09/2015  . High cholesterol   . Hypertension   . Macular degeneration   . Migraine   . Syncope and collapse 06/09/2015    Past Surgical History:  Procedure Laterality Date  . ABDOMINAL HYSTERECTOMY    . APPENDECTOMY    . CAROTID ARTERY ANGIOPLASTY Right   . CATARACT EXTRACTION Bilateral   . LUMBAR LAMINECTOMY      Current Medications: Outpatient Medications Prior to Visit  Medication Sig Dispense Refill  . apixaban (ELIQUIS) 5 MG TABS tablet Take 1 tablet (5 mg total) by mouth 2 (two) times daily. 60 tablet   . aspirin EC 81 MG EC tablet Take 1 tablet (81 mg total) by mouth daily.    Marland Kitchen atorvastatin (LIPITOR) 40 MG tablet Take 1 tablet (40 mg total) by mouth daily at 6 PM.    . beta carotene w/minerals (OCUVITE) tablet Take 1 tablet by mouth daily.    . Cholecalciferol (VITAMIN D-3 PO) Take 5,000 Int'l Units by mouth daily.    Marland Kitchen gabapentin (NEURONTIN) 300 MG capsule Take 1 capsule (300 mg total) by mouth 2 (two) times daily. 60 capsule 3  . insulin aspart (NOVOLOG) 100 UNIT/ML injection Before each  meal 3 times a day, 140-199 - 2 units, 200-250 - 4 units, 251-299 - 6 units,  300-349 - 8 units,  350 or above 10 units. Dispense syringes and needles as needed, Ok to switch to PEN if approved. Substitute to any brand approved. DX DM2, Code E11.65 1 vial 12  . JANUVIA 100 MG tablet Take 100 mg by mouth daily.  5  . LANTUS SOLOSTAR 100 UNIT/ML Solostar Pen Inject 12 Units into the skin at bedtime. 15 mL 0  . levothyroxine (SYNTHROID, LEVOTHROID) 50 MCG tablet TAKE 1 TABLET BY MOUTH DAILY ON EMPTY STOMACH WAIT 1 HOUR BEFORE AKING ANY OTHER MEDS/FOOD/DRINK  5  . metoprolol tartrate (LOPRESSOR) 25 MG tablet Take 0.5 tablets (12.5 mg total) by  mouth 2 (two) times daily.    . pantoprazole (PROTONIX) 40 MG tablet Take 1 tablet (40 mg total) by mouth daily.    . Plant Sterols and Stanols (CHOLESTOFF PO) Take by mouth daily.    . ranolazine (RANEXA) 500 MG 12 hr tablet Take 1 tablet (500 mg total) by mouth 2 (two) times daily.    . tamsulosin (FLOMAX) 0.4 MG CAPS capsule Take 1 capsule (0.4 mg total) by mouth daily. 30 capsule   . lisinopril (PRINIVIL,ZESTRIL) 5 MG tablet      No facility-administered medications prior to visit.      Allergies:   Patient has no known allergies.   Social History   Social History  . Marital status: Married    Spouse name: Fayrene FearingJames  . Number of children: 6  . Years of education: 9   Occupational History  . Retired    Social History Main Topics  . Smoking status: Never Smoker  . Smokeless tobacco: Never Used  . Alcohol use No  . Drug use: No  . Sexual activity: Not Asked   Other Topics Concern  . None   Social History Narrative   Lives at home w/ her husband   Right-handed   Drinks about 2 cups of coffee per day     Family History:  The patient's family history includes Dementia in her brother; Emphysema in her brother; Heart disease in her father; Prostate cancer in her father.   ROS:   Please see the history of present illness.    Review of Systems  Constitution: Positive for weakness and malaise/fatigue.  HENT: Negative.   Eyes: Negative.   Cardiovascular: Negative.   Respiratory: Negative.   Hematologic/Lymphatic: Negative.   Musculoskeletal: Positive for falls, muscle weakness, myalgias and stiffness. Negative for joint pain.  Gastrointestinal: Negative.   Genitourinary: Negative.   Neurological: Positive for headaches.   All other systems reviewed and are negative.   PHYSICAL EXAM:   VS:  BP 120/80   Pulse 64   Physical Exam  GEN: Well nourished, well developed, in no acute distress  Neck: no JVD, bilateral carotid bruits Cardiac:RRR; no murmurs, rubs, or gallops    Respiratory:  Increased breath sounds at the left lung base otherwise clear GI: soft, nontender, nondistended, + BS Ext: without cyanosis, clubbing, or edema, Good distal pulses bilaterally Psych: euthymic mood, full affect  Wt Readings from Last 3 Encounters:  03/01/16 168 lb 9.6 oz (76.5 kg)  09/09/15 160 lb 8 oz (72.8 kg)  06/09/15 155 lb (70.3 kg)      Studies/Labs Reviewed:   EKG:  EKG is ordered today.  The ekg ordered today demonstrates Normal sinus rhythm with first-degree AV block inferior Q waves ST-T wave changes  laterally  Recent Labs: 03/02/2016: ALT 18 03/05/2016: BUN <5; Creatinine, Ser 0.97; Potassium 4.0; Sodium 141 03/06/2016: Hemoglobin 10.9; Platelets 179   Lipid Panel    Component Value Date/Time   CHOL 161 03/02/2016 0332   TRIG 172 (H) 03/02/2016 0332   HDL 27 (L) 03/02/2016 0332   CHOLHDL 6.0 03/02/2016 0332   VLDL 34 03/02/2016 0332   LDLCALC 100 (H) 03/02/2016 0332    Additional studies/ records that were reviewed today include:    Echo 03/02/16 LV EF: 55% -   60%   ------------------------------------------------------------------- Indications:      Chest pain 786.51.   ------------------------------------------------------------------- History:   PMH:   Syncope.  Risk factors:  Hypertension. Diabetes mellitus.   ------------------------------------------------------------------- Study Conclusions   - Left ventricle: The cavity size was normal. Wall thickness was   increased in a pattern of moderate LVH. Systolic function was   normal. The estimated ejection fraction was in the range of 55%   to 60%. There is akinesis of the basalinferior myocardium.   Doppler parameters are consistent with abnormal left ventricular   relaxation (grade 1 diastolic dysfunction). Doppler parameters   are consistent with high ventricular filling pressure. - Mitral valve: There was moderate regurgitation. - Pulmonary arteries: PA peak pressure: 34 mm Hg (S). -  Pericardium, extracardiac: A trivial pericardial effusion was   identified.   Impressions:   - Akinesis of the basal inferior wall with overall normal LV   systolic function; grade 1 diastolic dysfunction with elevated LV   filling pressure; moderate LVH; calcified aortic valve with no AS   by doppler; moderate, posterior directed MR; mild TR.   *PRELIMINARY RESULTS* Vascular Ultrasound Carotid Duplex (Doppler) has been completed.   Findings suggest upper range 40-59% versus low range 60-79% right internal carotid artery stenosis and 80-99% left internal carotid artery stenosis. Vertebral arteries are patent with antegrade flow.   Preliminary results discussed with Dr. Randie Heinz.     ASSESSMENT:    1. Coronary artery disease due to lipid rich plaque   2. Acute CVA (cerebrovascular accident) (HCC)   3. Carotid disease, bilateral (HCC)   4. Essential hypertension   5. New onset atrial fibrillation (HCC)   6. Encounter for long-term (current) use of medications   7. At high risk for falls      PLAN:  In order of problems listed above:  CAD with recent NSTEMI peak troponins 2.46 treated medically because of the setting of acute CVA, DO NOT RESUSCITATE, age, A. fib and need for carotid surgery. We'll consider ischemic workup in the future if she has symptoms. She is having musculoskeletal chest pain related to her rib fracture. She denies any chest tightness or pressure. Continue metoprolol, lisinopril, Ranexa and aspirin. check labs today. F/U with Dr. Delton See in 2 months.  Ischemic stroke 02/27/16 with 3-4 mm infarct in the right frontal parietal junction without hemorrhage. CHADSVASC=8. Eliquis and ASA recommended by Neuro. Fall at nursing home with rib fracture. Discussed fall risk with patient  Carotid disease RICA 99% LICA CA and 60-79% R ICA. For surgery a couple weeks  A. fib with RVR CHADSVASC=8 patient normal sinus rhythm today.  Long-term use of medications. Will check CBC  and bmet on Eliquis.  High risk for falls with fall at the nursing facility. Discussed this in detail with patient.  Medication Adjustments/Labs and Tests Ordered: Current medicines are reviewed at length with the patient today.  Concerns regarding medicines are outlined above.  Medication changes,  Labs and Tests ordered today are listed in the Patient Instructions below. Patient Instructions  Medication Instructions:  The current medical regimen is effective;  continue present plan and medications.  Please have blood work today (BMP, CBC)  Follow-Up: Follow up in 2 months with Dr Delton See.  If you need a refill on your cardiac medications before your next appointment, please call your pharmacy.  Thank you for choosing [facility]!!        Signed, Jacolyn Reedy, PA-C  03/23/2016 10:19 AM    [facility] [address] [place], [place], [place]  [address] Phone: [phone]; Fax: [phone]

## 2016-03-23 NOTE — Patient Instructions (Addendum)
Medication Instructions:  The current medical regimen is effective;  continue present plan and medications.  Please have blood work today (BMP, CBC)  Follow-Up: Follow up in 2 months with Dr Delton SeeNelson.  If you need a refill on your cardiac medications before your next appointment, please call your pharmacy.  Thank you for choosing [facility]!!

## 2016-03-29 ENCOUNTER — Telehealth: Payer: Self-pay | Admitting: Cardiology

## 2016-03-29 DIAGNOSIS — Z79899 Other long term (current) drug therapy: Secondary | ICD-10-CM

## 2016-04-05 ENCOUNTER — Telehealth: Payer: Self-pay | Admitting: *Deleted

## 2016-04-05 NOTE — Telephone Encounter (Signed)
Pt daughter called.  The Ranexa is costing pt $158 for 1 month and this will send her into the donut whole sooner and pt cannot afford this.  They are wanting to try something cheaper.  Please advise!

## 2016-04-05 NOTE — Telephone Encounter (Signed)
-----   Message from Dyann KiefMichele M Lenze, PA-C sent at 03/27/2016  7:52 AM EST ----- Blood count stable. Glucose high and kidney function up a little. Will need bmet in 2 weeks since she's on eliquis

## 2016-04-05 NOTE — Telephone Encounter (Signed)
New message  Pt daughter call requesting to speak with RN. Pt did get on the phone stating it was okay to give information to daughter. Please call back to discuss

## 2016-04-06 MED ORDER — ISOSORBIDE MONONITRATE ER 30 MG PO TB24: 30.0000 mg | ORAL_TABLET | Freq: Every day | ORAL | 3 refills | Status: DC

## 2016-04-06 NOTE — Telephone Encounter (Signed)
Pts daughter, Myriam Jacob[place]Helen[place], has been made aware that we are changing her Ranexa, (due to cost) to Imdur 30 mg taking 1 daily.  She requested pt to finish what they already paid for on the Ranexa and start Imdur afterwards.  I advised her that was fine.  She was very appreciative of the call

## 2016-04-06 NOTE — Telephone Encounter (Signed)
Try imdur 30 mg once daily. Let her know she may have a mild headache for 2-3 days but it should get better.

## 2016-04-07 ENCOUNTER — Encounter: Payer: Self-pay | Admitting: Vascular Surgery

## 2016-04-10 ENCOUNTER — Other Ambulatory Visit: Payer: Self-pay | Admitting: Neurology

## 2016-04-14 ENCOUNTER — Ambulatory Visit (INDEPENDENT_AMBULATORY_CARE_PROVIDER_SITE_OTHER): Payer: Medicare Other | Admitting: Vascular Surgery

## 2016-04-14 ENCOUNTER — Encounter: Payer: Self-pay | Admitting: Vascular Surgery

## 2016-04-14 ENCOUNTER — Other Ambulatory Visit: Payer: Medicare Other | Admitting: *Deleted

## 2016-04-14 VITALS — BP 113/66 | HR 68 | Temp 98.0°F | Resp 14 | Ht 64.0 in | Wt 151.0 lb

## 2016-04-14 DIAGNOSIS — I6522 Occlusion and stenosis of left carotid artery: Secondary | ICD-10-CM

## 2016-04-14 DIAGNOSIS — Z79899 Other long term (current) drug therapy: Secondary | ICD-10-CM

## 2016-04-14 NOTE — Progress Notes (Signed)
Patient ID: Valerie Arnold, female   DOB: Jun 15, 1927, 83 y.o.   MRN: [id number]  Reason for Consult: New Evaluation (carotid  CVA  was seen in hospital)   Referred by Achreja, Youlanda Mighty, *  Subjective:     HPI:  Valerie Arnold is a 83 y.o. female follows up from in-hospital consultation where she had a large right-sided stroke. Her entire workup was performed at [facility] so we did never have records of her MRI but did show CVA of her frontoparietal region. We had carotid duplexes and CTA which demonstrated mostly clean right carotid with common carotid disease and significant stenosis of her left internal carotid artery. She was also found to be in atrial fibrillation is now maintained on anticoagulation with aspirin and statin drugs. In the hospital she was bedbound nonverbal and prognosis was generally poor. She is now at home although staying with a family member full-time. She has not fully regained her strength at this time but has not had recurrent stroke TIA and is never had amaurosis. She does feel weak although is eating some at this point is here for evaluation of her carotid stenosis.  Past Medical History:  Diagnosis Date  . Anxiety   . Depression   . Diabetes (HCC)   . Headache 06/09/2015  . High cholesterol   . Hypertension   . Macular degeneration   . Migraine   . Syncope and collapse 06/09/2015   Family History  Problem Relation Age of Onset  . Prostate cancer Father   . Heart disease Father   . Dementia Brother   . Emphysema Brother    Past Surgical History:  Procedure Laterality Date  . ABDOMINAL HYSTERECTOMY    . APPENDECTOMY    . CAROTID ARTERY ANGIOPLASTY Right   . CATARACT EXTRACTION Bilateral   . LUMBAR LAMINECTOMY      Short Social History:  Social History  Substance Use Topics  . Smoking status: Never Smoker  . Smokeless tobacco: Never Used  . Alcohol use No    No Known Allergies  Current Outpatient Prescriptions  Medication Sig  Dispense Refill  . apixaban (ELIQUIS) 5 MG TABS tablet Take 1 tablet (5 mg total) by mouth 2 (two) times daily. 60 tablet   . aspirin EC 81 MG EC tablet Take 1 tablet (81 mg total) by mouth daily.    Marland Kitchen atorvastatin (LIPITOR) 40 MG tablet Take 1 tablet (40 mg total) by mouth daily at 6 PM.    . beta carotene w/minerals (OCUVITE) tablet Take 1 tablet by mouth daily.    Marland Kitchen LANTUS SOLOSTAR 100 UNIT/ML Solostar Pen Inject 12 Units into the skin at bedtime. 15 mL 0  . levothyroxine (SYNTHROID, LEVOTHROID) 50 MCG tablet TAKE 1 TABLET BY MOUTH DAILY ON EMPTY STOMACH WAIT 1 HOUR BEFORE AKING ANY OTHER MEDS/FOOD/DRINK  5  . metFORMIN (GLUCOPHAGE-XR) 500 MG 24 hr tablet Take 500 mg by mouth 2 (two) times daily with a meal.  3  . metoprolol tartrate (LOPRESSOR) 25 MG tablet Take 0.5 tablets (12.5 mg total) by mouth 2 (two) times daily.    . pantoprazole (PROTONIX) 40 MG tablet Take 1 tablet (40 mg total) by mouth daily.    . Plant Sterols and Stanols (CHOLESTOFF PO) Take by mouth daily.    Marland Kitchen RANEXA 500 MG 12 hr tablet Take 500 mg by mouth 2 (two) times daily.  0  . amoxicillin (AMOXIL) 500 MG capsule TAKE ONE CAPSULE BY MOUTH THREE TIMES DAILY FOR  7 DAYS  0  . Cholecalciferol (VITAMIN D-3 PO) Take 5,000 Int'l Units by mouth daily.    Marland Kitchen gabapentin (NEURONTIN) 300 MG capsule TAKE ONE CAPSULE BY MOUTH TWICE DAILY (Patient not taking: Reported on 04/14/2016) 60 capsule 3  . insulin aspart (NOVOLOG) 100 UNIT/ML injection Before each meal 3 times a day, 140-199 - 2 units, 200-250 - 4 units, 251-299 - 6 units,  300-349 - 8 units,  350 or above 10 units. Dispense syringes and needles as needed, Ok to switch to PEN if approved. Substitute to any brand approved. DX DM2, Code E11.65 (Patient not taking: Reported on 04/14/2016) 1 vial 12  . isosorbide mononitrate (IMDUR) 30 MG 24 hr tablet Take 1 tablet (30 mg total) by mouth daily. (Patient not taking: Reported on 04/14/2016) 90 tablet 3  . JANUVIA 100 MG tablet Take 100 mg  by mouth daily.  5  . lisinopril (PRINIVIL,ZESTRIL) 2.5 MG tablet Take 2.5 mg by mouth daily.    . tamsulosin (FLOMAX) 0.4 MG CAPS capsule Take 1 capsule (0.4 mg total) by mouth daily. (Patient not taking: Reported on 04/14/2016) 30 capsule    No current facility-administered medications for this visit.     Review of Systems  Constitutional: Positive for fatigue.  HENT: Positive for trouble swallowing.  Eyes: Eyes negative.  Cardiovascular: Cardiovascular negative.  Musculoskeletal: Musculoskeletal negative.  Skin: Skin negative.  Neurological: Neurological negative. Hematologic: Hematologic/lymphatic negative.  Psychiatric: Psychiatric negative.        Objective:  Objective   Vitals:   04/14/16 0842 04/14/16 0849 04/14/16 0851  BP: (!) 146/64 (!) 147/64 113/66  Pulse: 68 68 68  Resp: 14    Temp: 98 F (36.7 C)    SpO2: 98%    Weight: 151 lb (68.5 kg)    Height: 5\' 4"  (1.626 m)     Body mass index is 25.92 kg/m.  Physical Exam  Constitutional: She is oriented to person, place, and time. She appears well-developed.  HENT:  Head: Normocephalic.  Eyes: Pupils are equal, round, and reactive to light.  Neck: Normal range of motion.  Cardiovascular: Normal rate.   Pulmonary/Chest: Effort normal.  Abdominal: Soft.  Musculoskeletal: Normal range of motion. She exhibits no edema.  Neurological: She is alert and oriented to person, place, and time.  Skin: Skin is warm and dry.  Psychiatric: She has a normal mood and affect. Her behavior is normal. Judgment and thought content normal.    Data: I again reviewed her carotid duplexes as well as CT angiogram demonstrating high-grade stenosis of her left internal carotid artery.     Assessment/Plan:     83yo female follows up from hospitalization where she had large right-sided stroke likely from atrial fibrillation. She also has a symptomatically lesion on the left which is very high-grade. Unfortunately at this time she  remains weak and is not fully recovered from her previous stroke. She is now medically medical management with anticoagulation aspirin and statin drugs. In discussion with her daughter she is in no state to undergo surgery at this time particularly one that would be only preventative which would be left-sided carotid endarterectomy versus stenting. We will follow up in 6 months with repeat carotid duplex hopefully at that time she has regained her strength will have positive prognostic factors and we could discuss left-sided carotid endarterectomy to prevent left-sided stroke. We discussed the signs and symptoms of stroke which did demonstrate good understanding and we will see her in 6 months should  she not need to be seen sooner.   I spent 15 minutes of time with this patient and her daughter of which 50% was spent in counseling and coordination of care with them both.  Maeola HarmanBrandon Christopher Cain MD [facility]

## 2016-04-15 LAB — BASIC METABOLIC PANEL
BUN/Creatinine Ratio: 7 — ABNORMAL LOW (ref 12–28)
BUN: 7 mg/dL — ABNORMAL LOW (ref 8–27)
CALCIUM: 9.1 mg/dL (ref 8.7–10.3)
CHLORIDE: 100 mmol/L (ref 96–106)
CO2: 26 mmol/L (ref 18–29)
Creatinine, Ser: 0.96 mg/dL (ref 0.57–1.00)
GFR calc Af Amer: 62 mL/min/{1.73_m2} (ref >59)
GFR calc non Af Amer: 54 mL/min/{1.73_m2} — ABNORMAL LOW (ref >59)
Glucose: 252 mg/dL — ABNORMAL HIGH (ref 65–99)
POTASSIUM: 4.4 mmol/L (ref 3.5–5.2)
Sodium: 141 mmol/L (ref 134–144)

## 2016-04-17 NOTE — Addendum Note (Signed)
Addended by: Burton ApleyPETTY, Josceline Chenard A on: 04/17/2016 04:51 PM   Modules accepted: Orders

## 2016-04-18 ENCOUNTER — Telehealth: Payer: Self-pay | Admitting: Physician Assistant

## 2016-04-18 NOTE — Telephone Encounter (Signed)
-----   Message from Dyann KiefMichele M Lenze, PA-C sent at 04/16/2016  4:43 PM EDT ----- One of your kidney functions is a little low. Are you eating and drinking enough? ? Any blood loss or black stools? Increase water intake. Repeat bmet in 3-4 weeks

## 2016-04-18 NOTE — Telephone Encounter (Signed)
Returned [place] call, and we discussed pts and went over her lab results.

## 2016-04-18 NOTE — Telephone Encounter (Signed)
New message ° ° ° ° °Returning a call to the nurse to get lab results °

## 2016-04-20 ENCOUNTER — Telehealth: Payer: Self-pay | Admitting: *Deleted

## 2016-04-20 MED ORDER — LANTUS SOLOSTAR 100 UNIT/ML ~~LOC~~ SOPN: 20.0000 [IU] | PEN_INJECTOR | Freq: Every day | SUBCUTANEOUS | 0 refills | Status: DC

## 2016-04-20 MED ORDER — BETA CAROTENE 25000 UNITS PO CAPS: 25000.0000 [IU] | ORAL_CAPSULE | Freq: Every day | ORAL | 0 refills | Status: DC

## 2016-04-20 MED ORDER — METOPROLOL SUCCINATE ER 25 MG PO TB24: 12.5000 mg | ORAL_TABLET | Freq: Every day | ORAL | 3 refills | Status: DC

## 2016-04-20 NOTE — Telephone Encounter (Signed)
Pt daughter returned my call and she is aware of the med change.

## 2016-04-20 NOTE — Telephone Encounter (Signed)
-----   Message from Dyann KiefMichele M Lenze, [place]-C sent at 04/19/2016  9:40 AM EDT ----- I would rather she take the metoprolol twice a day because she is not on long-acting metoprolol. She can decrease lisinopril in half and keep track of her blood pressures. She also had fast atrial fibrillation in the past and the metoprolol will help keep the heart rate under control. ----- Message ----- From: Elliot CousinJennifer Bethenny Losee, RMA Sent: 04/18/2016   3:41 PM To: Dyann KiefMichele M Lenze, PA-C  Spoke with pts daughter, Myriam [place]Helen, [place].   She states that the pt drinks 6 or more of the 16.9 oz bottle of water every day from 6 - 5 everyday and not sure how much at night, but stated that her mother drinks plenty of fluid.  She did mention that the pt has had some bp drops here recently.  [phone]  112/54 And stated that the PT/OT had to stop therapy due to her low bp ad now she is taking 1/2 Metoprolol per the PT and her bp has   been doing fine.  Can they continue that?  She has been advised to keep a log of her bp. 118/62  128/61 110/68   125/62...is the last few readings on the 1/2 Metoprolol.  Please advise!

## 2016-04-20 NOTE — Telephone Encounter (Signed)
Change metoprolol to long acting succinate 25 mg 1/2 tablet daily

## 2016-04-20 NOTE — Telephone Encounter (Signed)
Returned the call to pts daughter, Myriam Jacob[place]Helen, [place].  Left a message for her to call back so we can make her aware of pts med change from Lopressor to Metoprolol Succinate 25 mg taking 1/2 tablet daily.

## 2016-04-20 NOTE — Telephone Encounter (Signed)
Follow up      Pt daughter is returning [place] call

## 2016-04-20 NOTE — Telephone Encounter (Signed)
Per daughter, Myriam Jacob[place]Helen, [place], pt hasn't taken the Lisinopril since before February. Our med list was incorrect and I went thru with the daughter and now her med list is correct.  I did advise for her that pt needed to take the Metoprolol bid but she is concerned with her bp and that it goes too low and they are not able to do the pt/ot. She advised that since she has only been taking 1 Metoprolol that she has been able to do all of her exercises.  Please advise!

## 2016-04-21 DIAGNOSIS — E871 Hypo-osmolality and hyponatremia: Secondary | ICD-10-CM

## 2016-04-21 DIAGNOSIS — I482 Chronic atrial fibrillation: Secondary | ICD-10-CM

## 2016-04-21 DIAGNOSIS — Z7901 Long term (current) use of anticoagulants: Secondary | ICD-10-CM | POA: Diagnosis not present

## 2016-04-21 DIAGNOSIS — I160 Hypertensive urgency: Secondary | ICD-10-CM | POA: Diagnosis not present

## 2016-04-21 DIAGNOSIS — E119 Type 2 diabetes mellitus without complications: Secondary | ICD-10-CM

## 2016-04-21 DIAGNOSIS — F0390 Unspecified dementia without behavioral disturbance: Secondary | ICD-10-CM | POA: Diagnosis not present

## 2016-04-22 DIAGNOSIS — I160 Hypertensive urgency: Secondary | ICD-10-CM | POA: Diagnosis not present

## 2016-04-22 DIAGNOSIS — E871 Hypo-osmolality and hyponatremia: Secondary | ICD-10-CM | POA: Diagnosis not present

## 2016-04-22 DIAGNOSIS — E119 Type 2 diabetes mellitus without complications: Secondary | ICD-10-CM | POA: Diagnosis not present

## 2016-04-22 DIAGNOSIS — Z7901 Long term (current) use of anticoagulants: Secondary | ICD-10-CM | POA: Diagnosis not present

## 2016-04-22 DIAGNOSIS — F0390 Unspecified dementia without behavioral disturbance: Secondary | ICD-10-CM | POA: Diagnosis not present

## 2016-04-22 DIAGNOSIS — I482 Chronic atrial fibrillation: Secondary | ICD-10-CM | POA: Diagnosis not present

## 2016-04-23 DIAGNOSIS — Z7901 Long term (current) use of anticoagulants: Secondary | ICD-10-CM | POA: Diagnosis not present

## 2016-04-23 DIAGNOSIS — E119 Type 2 diabetes mellitus without complications: Secondary | ICD-10-CM | POA: Diagnosis not present

## 2016-04-23 DIAGNOSIS — I482 Chronic atrial fibrillation: Secondary | ICD-10-CM | POA: Diagnosis not present

## 2016-04-23 DIAGNOSIS — I160 Hypertensive urgency: Secondary | ICD-10-CM | POA: Diagnosis not present

## 2016-04-23 DIAGNOSIS — F0390 Unspecified dementia without behavioral disturbance: Secondary | ICD-10-CM | POA: Diagnosis not present

## 2016-04-23 DIAGNOSIS — E871 Hypo-osmolality and hyponatremia: Secondary | ICD-10-CM | POA: Diagnosis not present

## 2016-04-26 ENCOUNTER — Ambulatory Visit: Payer: Medicare Other | Admitting: Diagnostic Neuroimaging

## 2016-05-31 ENCOUNTER — Encounter: Payer: Self-pay | Admitting: Cardiology

## 2016-05-31 ENCOUNTER — Ambulatory Visit (INDEPENDENT_AMBULATORY_CARE_PROVIDER_SITE_OTHER): Payer: Medicare Other | Admitting: Cardiology

## 2016-05-31 VITALS — BP 126/74 | HR 63 | Ht 64.0 in | Wt 149.0 lb

## 2016-05-31 DIAGNOSIS — Z789 Other specified health status: Secondary | ICD-10-CM | POA: Diagnosis not present

## 2016-05-31 DIAGNOSIS — M791 Myalgia, unspecified site: Secondary | ICD-10-CM

## 2016-05-31 DIAGNOSIS — I2583 Coronary atherosclerosis due to lipid rich plaque: Secondary | ICD-10-CM | POA: Diagnosis not present

## 2016-05-31 DIAGNOSIS — E7849 Other hyperlipidemia: Secondary | ICD-10-CM

## 2016-05-31 DIAGNOSIS — I739 Peripheral vascular disease, unspecified: Secondary | ICD-10-CM

## 2016-05-31 DIAGNOSIS — E784 Other hyperlipidemia: Secondary | ICD-10-CM | POA: Diagnosis not present

## 2016-05-31 DIAGNOSIS — I639 Cerebral infarction, unspecified: Secondary | ICD-10-CM

## 2016-05-31 DIAGNOSIS — I4891 Unspecified atrial fibrillation: Secondary | ICD-10-CM | POA: Diagnosis not present

## 2016-05-31 DIAGNOSIS — I779 Disorder of arteries and arterioles, unspecified: Secondary | ICD-10-CM

## 2016-05-31 DIAGNOSIS — I10 Essential (primary) hypertension: Secondary | ICD-10-CM

## 2016-05-31 DIAGNOSIS — I2510 Atherosclerotic heart disease of native coronary artery without angina pectoris: Secondary | ICD-10-CM

## 2016-05-31 DIAGNOSIS — R55 Syncope and collapse: Secondary | ICD-10-CM

## 2016-05-31 MED ORDER — ATORVASTATIN CALCIUM 20 MG PO TABS: 20.0000 mg | ORAL_TABLET | Freq: Every day | ORAL | 3 refills | Status: DC

## 2016-05-31 NOTE — Patient Instructions (Signed)
Medication Instructions:   DECREASE YOUR ATORVASTATIN TO 20 MG ONCE DAILY    Follow-Up:  3 MONTHS WITH DR Delton See       If you need a refill on your cardiac medications before your next appointment, please call your pharmacy.

## 2016-05-31 NOTE — Progress Notes (Signed)
Cardiology Office Note    Date:  05/31/2016   ID:  Valerie Arnold, DOB 10/04/1927, MRN [id number]  PCP:  Anselmo Pickler, MD  Cardiologist: Dr. Delton See  No chief complaint on file.   History of Present Illness:  Valerie Arnold is a 83 y.o. female with PMH significant for DM (insulin dependent), HTN, CKD stage 2, and HLD. No prior CAD. She was transferred here from [facility] with presentation of ischemic stroke on 02/27/2016 (acute 3-4 mm infarct in the right frontoparietal junction without hemorrhage or mass effect). Afib RVR as a new dg on admission to [facility], cardioverted spontaneously into SR with metoprolol, and NSTEMI Troponin 1.52--> 2.46 (6 in [facility]). No chest pain, CVA, DNR, advanced age so managed medically.  2-D echo showed normal LV function, akinesis of the basal inferior wall, grade 1 DD and elevated LV filling pressures, moderate LVH, mild to moderate MR.CHADSVASC=8.  Carotid Dopplers were performed, revealing high-grade left carotid bifurcation stenosis involving the proximal ICA with near occlusion, as well as mild diffuse disease on the right. Vascular surgery at [facility] was consulted regarding the high-grade carotid stenosis, but it was advised that the patient will need to wait at least 2-3 weeks for consideration of surgery given the acute MI and acute stroke.   Patient had a fall in nursing home when she tried to get up in middle of the night. She fell on her left side and broke a rib. She's having a lot of chest pain and shortness of breath when she tries to take a deep breath. She says she wasn't having any chest pain before falling. She denies tightness or pressure. She has to hold her chest to make her feel better. They're talking about discharging her from the nursing home Saturday. She lives at home with her husband but he can't take care of her. They see the vascular surgeons in one month. No further palpitations or fast heart rates. She is very  tired and fatigued. Has good days and bad days. Sometimes she just wants to sleep all day long.  05/31/2016 - 3 months follow-up, the patient is doing well other than orthostatic hypotension when she stands up, since the last visit she had one fall, she now has 24/7 sitter who is watching her. She denies palpitations. She has occasional second lasting epigastric pains when she is at rest, no radiation and no associated dyspnea on exertion. She has developed muscle pain with atorvastatin and is concerned about memory impairment.   Past Medical History:  Diagnosis Date  . Anxiety   . Depression   . Diabetes (HCC)   . Headache 06/09/2015  . High cholesterol   . Hypertension   . Macular degeneration   . Migraine   . Syncope and collapse 06/09/2015    Past Surgical History:  Procedure Laterality Date  . ABDOMINAL HYSTERECTOMY    . APPENDECTOMY    . CAROTID ARTERY ANGIOPLASTY Right   . CATARACT EXTRACTION Bilateral   . LUMBAR LAMINECTOMY      Current Medications: Outpatient Medications Prior to Visit  Medication Sig Dispense Refill  . apixaban (ELIQUIS) 5 MG TABS tablet Take 1 tablet (5 mg total) by mouth 2 (two) times daily. 60 tablet   . aspirin EC 81 MG EC tablet Take 1 tablet (81 mg total) by mouth daily.    . beta carotene 40981 UNIT capsule Take 1 capsule (25,000 Units total) by mouth daily. 90 capsule 0  . beta carotene w/minerals (  OCUVITE) tablet Take 1 tablet by mouth daily.    . Cholecalciferol (VITAMIN D-3 PO) Take 5,000 Int'l Units by mouth daily.    Marland Kitchen gabapentin (NEURONTIN) 300 MG capsule TAKE ONE CAPSULE BY MOUTH TWICE DAILY 60 capsule 3  . isosorbide mononitrate (IMDUR) 30 MG 24 hr tablet Take 1 tablet (30 mg total) by mouth daily. 90 tablet 3  . LANTUS SOLOSTAR 100 UNIT/ML Solostar Pen Inject 20 Units into the skin daily. 15 mL 0  . levothyroxine (SYNTHROID, LEVOTHROID) 50 MCG tablet TAKE 1 TABLET BY MOUTH DAILY ON EMPTY STOMACH WAIT 1 HOUR BEFORE AKING ANY OTHER  MEDS/FOOD/DRINK  5  . metFORMIN (GLUCOPHAGE-XR) 500 MG 24 hr tablet Take 500 mg by mouth 2 (two) times daily with a meal.  3  . metoprolol succinate (TOPROL XL) 25 MG 24 hr tablet Take 0.5 tablets (12.5 mg total) by mouth daily. 45 tablet 3  . pantoprazole (PROTONIX) 40 MG tablet Take 1 tablet (40 mg total) by mouth daily.    . Plant Sterols and Stanols (CHOLESTOFF PO) Take by mouth daily.    Marland Kitchen atorvastatin (LIPITOR) 40 MG tablet Take 1 tablet (40 mg total) by mouth daily at 6 PM.    . RANEXA 500 MG 12 hr tablet Take 500 mg by mouth 2 (two) times daily.  0   No facility-administered medications prior to visit.      Allergies:   Patient has no known allergies.   Social History   Social History  . Marital status: Married    Spouse name: Fayrene Fearing  . Number of children: 6  . Years of education: 9   Occupational History  . Retired    Social History Main Topics  . Smoking status: Never Smoker  . Smokeless tobacco: Never Used  . Alcohol use No  . Drug use: No  . Sexual activity: Not Asked   Other Topics Concern  . None   Social History Narrative   Lives at home w/ her husband   Right-handed   Drinks about 2 cups of coffee per day     Family History:  The patient's family history includes Dementia in her brother; Emphysema in her brother; Heart disease in her father; Prostate cancer in her father.   ROS:   Please see the history of present illness.    Review of Systems  Constitution: Positive for weakness and malaise/fatigue.  HENT: Negative.   Eyes: Negative.   Cardiovascular: Negative.   Respiratory: Negative.   Hematologic/Lymphatic: Negative.   Musculoskeletal: Positive for falls, muscle weakness, myalgias and stiffness. Negative for joint pain.  Gastrointestinal: Negative.   Genitourinary: Negative.   Neurological: Positive for headaches.   All other systems reviewed and are negative.   PHYSICAL EXAM:   VS:  BP 126/74   Pulse 63   Ht  (1.626 m)   Wt 149 lb  (67.6 kg)   SpO2 98%   BMI 25.58 kg/m   Physical Exam  GEN: Well nourished, well developed, in no acute distress  Neck: no JVD, bilateral carotid bruits Cardiac:RRR; no murmurs, rubs, or gallops  Respiratory:  Increased breath sounds at the left lung base otherwise clear GI: soft, nontender, nondistended, + BS Ext: without cyanosis, clubbing, or edema, Good distal pulses bilaterally Psych: euthymic mood, full affect  Wt Readings from Last 3 Encounters:  05/31/16 149 lb (67.6 kg)  04/14/16 151 lb (68.5 kg)  03/01/16 168 lb 9.6 oz (76.5 kg)      Studies/Labs Reviewed:  EKG:  EKG is ordered today.  The ekg ordered today demonstrates Normal sinus rhythm with first-degree AV block inferior Q waves ST-T wave changes laterally  Recent Labs: 03/02/2016: ALT 18 03/06/2016: Hemoglobin 10.9 03/23/2016: Platelets 253 04/14/2016: BUN 7; Creatinine, Ser 0.96; Potassium 4.4; Sodium 141   Lipid Panel    Component Value Date/Time   CHOL 161 03/02/2016 0332   TRIG 172 (H) 03/02/2016 0332   HDL 27 (L) 03/02/2016 0332   CHOLHDL 6.0 03/02/2016 0332   VLDL 34 03/02/2016 0332   LDLCALC 100 (H) 03/02/2016 0332    Additional studies/ records that were reviewed today include:    Echo 03/02/16 LV EF: 55% -   60%   ------------------------------------------------------------------- Indications:      Chest pain 786.51.   ------------------------------------------------------------------- History:   PMH:   Syncope.  Risk factors:  Hypertension. Diabetes mellitus.   ------------------------------------------------------------------- Study Conclusions   - Left ventricle: The cavity size was normal. Wall thickness was   increased in a pattern of moderate LVH. Systolic function was   normal. The estimated ejection fraction was in the range of 55%   to 60%. There is akinesis of the basalinferior myocardium.   Doppler parameters are consistent with abnormal left ventricular   relaxation (grade 1  diastolic dysfunction). Doppler parameters   are consistent with high ventricular filling pressure. - Mitral valve: There was moderate regurgitation. - Pulmonary arteries: PA peak pressure: 34 mm Hg (S). - Pericardium, extracardiac: A trivial pericardial effusion was   identified.   Impressions:   - Akinesis of the basal inferior wall with overall normal LV   systolic function; grade 1 diastolic dysfunction with elevated LV   filling pressure; moderate LVH; calcified aortic valve with no AS   by doppler; moderate, posterior directed MR; mild TR.   *PRELIMINARY RESULTS* Vascular Ultrasound Carotid Duplex (Doppler) has been completed.   Findings suggest upper range 40-59% versus low range 60-79% right internal carotid artery stenosis and 80-99% left internal carotid artery stenosis. Vertebral arteries are patent with antegrade flow.   Preliminary results discussed with Dr. Randie Heinz.     ASSESSMENT:    1. Myalgia   2. Coronary artery disease due to lipid rich plaque   3. Essential hypertension   4. Other hyperlipidemia      PLAN:  In order of problems listed above:  1. CAD with recent NSTEMI peak troponins 2.46 treated medically because of the setting of acute CVA, DO NOT RESUSCITATE, age, A. fib and need for carotid surgery. No ischemic workup right now, possibly in the future if she has symptoms. She is having musculoskeletal chest pain related to her rib fracture. She denies any chest tightness or pressure. Continue Toprol XL 12.5 mg po daily - she cant tolerate higher dose, lisinopril, imdur discontinued sec to side effects.   2. Ischemic stroke 02/27/16 with 3-4 mm infarct in the right frontal parietal junction without hemorrhage. CHADSVASC=8. Eliquis and ASA recommended by Neuro. Fall at nursing home with rib fracture. Discussed fall risk with patient Carotid disease RICA 99% LICA CA and 60-79% R ICA. Vascular surgery prefers medical management considering recent large  stroke.  3. A. fib with RVR CHADSVASC=8 patient normal sinus rhythm today. No bleeding with Eliquis.  4. Long-term use of medications. CBC normal, crea normal.  5. Hyperlipidemia - decrease atorvastatin to 20 mg po daily as she has myalgias and memory impairment.  Medication Adjustments/Labs and Tests Ordered: Current medicines are reviewed at length with the patient today.  Concerns regarding medicines are outlined above.  Medication changes, Labs and Tests ordered today are listed in the Patient Instructions below. Patient Instructions  Medication Instructions:   DECREASE YOUR ATORVASTATIN TO 20 MG ONCE DAILY    Follow-Up:  3 MONTHS WITH DR Delton See       If you need a refill on your cardiac medications before your next appointment, please call your pharmacy.      Signed, Tobias Alexander, MD  05/31/2016 11:10 AM    [facility] [address] [place], [place], [place]  [address] Phone: [phone]; Fax: [phone]

## 2016-06-17 DIAGNOSIS — E860 Dehydration: Secondary | ICD-10-CM | POA: Diagnosis not present

## 2016-06-17 DIAGNOSIS — N390 Urinary tract infection, site not specified: Secondary | ICD-10-CM | POA: Diagnosis not present

## 2016-06-17 DIAGNOSIS — R531 Weakness: Secondary | ICD-10-CM | POA: Diagnosis not present

## 2016-06-17 DIAGNOSIS — I639 Cerebral infarction, unspecified: Secondary | ICD-10-CM | POA: Diagnosis not present

## 2016-06-17 DIAGNOSIS — I10 Essential (primary) hypertension: Secondary | ICD-10-CM

## 2016-06-18 DIAGNOSIS — I639 Cerebral infarction, unspecified: Secondary | ICD-10-CM | POA: Diagnosis not present

## 2016-06-18 DIAGNOSIS — N390 Urinary tract infection, site not specified: Secondary | ICD-10-CM | POA: Diagnosis not present

## 2016-06-18 DIAGNOSIS — R531 Weakness: Secondary | ICD-10-CM | POA: Diagnosis not present

## 2016-06-18 DIAGNOSIS — E860 Dehydration: Secondary | ICD-10-CM | POA: Diagnosis not present

## 2016-06-18 DIAGNOSIS — I10 Essential (primary) hypertension: Secondary | ICD-10-CM | POA: Diagnosis not present

## 2016-06-22 ENCOUNTER — Telehealth: Payer: Self-pay | Admitting: Neurology

## 2016-06-22 NOTE — Telephone Encounter (Signed)
I called patient. I left a message. If the patient is amenable to having another carotid Doppler study done, she will call us back.   Carotid Doppler study 06/24/15:  Carotid Doppler study shows 40-59% stenosis on the right, this the side of the carotid endarterectomy. Left side shows 60-79% stenosis, vertebral artery flow is normal.

## 2016-07-10 ENCOUNTER — Ambulatory Visit: Payer: Medicare Other | Admitting: Neurology

## 2016-08-21 ENCOUNTER — Other Ambulatory Visit: Payer: Self-pay | Admitting: Neurology

## 2016-08-22 ENCOUNTER — Other Ambulatory Visit: Payer: Self-pay

## 2016-08-22 MED ORDER — GABAPENTIN 300 MG PO CAPS: 300.0000 mg | ORAL_CAPSULE | Freq: Two times a day (BID) | ORAL | 0 refills | Status: DC

## 2016-09-04 ENCOUNTER — Encounter: Payer: Self-pay | Admitting: Cardiology

## 2016-09-19 ENCOUNTER — Other Ambulatory Visit: Payer: Self-pay | Admitting: Neurology

## 2016-09-21 ENCOUNTER — Other Ambulatory Visit: Payer: Self-pay | Admitting: *Deleted

## 2016-09-21 ENCOUNTER — Encounter: Payer: Self-pay | Admitting: Cardiology

## 2016-09-21 ENCOUNTER — Ambulatory Visit (INDEPENDENT_AMBULATORY_CARE_PROVIDER_SITE_OTHER): Payer: Medicare Other | Admitting: Cardiology

## 2016-09-21 VITALS — BP 120/62 | HR 80 | Ht 64.0 in | Wt 147.0 lb

## 2016-09-21 DIAGNOSIS — I2583 Coronary atherosclerosis due to lipid rich plaque: Secondary | ICD-10-CM | POA: Diagnosis not present

## 2016-09-21 DIAGNOSIS — I2510 Atherosclerotic heart disease of native coronary artery without angina pectoris: Secondary | ICD-10-CM | POA: Diagnosis not present

## 2016-09-21 DIAGNOSIS — I10 Essential (primary) hypertension: Secondary | ICD-10-CM | POA: Diagnosis not present

## 2016-09-21 DIAGNOSIS — E785 Hyperlipidemia, unspecified: Secondary | ICD-10-CM | POA: Diagnosis not present

## 2016-09-21 DIAGNOSIS — R600 Localized edema: Secondary | ICD-10-CM | POA: Diagnosis not present

## 2016-09-21 DIAGNOSIS — I639 Cerebral infarction, unspecified: Secondary | ICD-10-CM | POA: Diagnosis not present

## 2016-09-21 MED ORDER — APIXABAN 5 MG PO TABS: 5.0000 mg | ORAL_TABLET | Freq: Two times a day (BID) | ORAL | 2 refills | Status: DC

## 2016-09-21 NOTE — Progress Notes (Signed)
Cardiology Office Note    Date:  09/21/2016   ID:  Valerie Arnold, DOB 08/10/1927, MRN [id number]  PCP:  Anselmo Pickler, MD  Cardiologist: Dr. Delton See  Chief complain: 3 months follow up  History of Present Illness:  Valerie Arnold is a 82 y.o. female with PMH significant for DM (insulin dependent), HTN, CKD stage 2, and HLD. No prior CAD. She was transferred here from [facility] with presentation of ischemic stroke on 02/27/2016 (acute 3-4 mm infarct in the right frontoparietal junction without hemorrhage or mass effect). Afib RVR as a new dg on admission to [facility], cardioverted spontaneously into SR with metoprolol, and NSTEMI Troponin 1.52--> 2.46 (6 in [facility]). No chest pain, CVA, DNR, advanced age so managed medically.  2-D echo showed normal LV function, akinesis of the basal inferior wall, grade 1 DD and elevated LV filling pressures, moderate LVH, mild to moderate MR.CHADSVASC=8.  Carotid Dopplers were performed, revealing high-grade left carotid bifurcation stenosis involving the proximal ICA with near occlusion, as well as mild diffuse disease on the right. Vascular surgery at [facility] was consulted regarding the high-grade carotid stenosis, but it was advised that the patient will need to wait at least 2-3 weeks for consideration of surgery given the acute MI and acute stroke.   Patient had a fall in nursing home when she tried to get up in middle of the night. She fell on her left side and broke a rib. She's having a lot of chest pain and shortness of breath when she tries to take a deep breath. She says she wasn't having any chest pain before falling. She denies tightness or pressure. She has to hold her chest to make her feel better. They're talking about discharging her from the nursing home Saturday. She lives at home with her husband but he can't take care of her. They see the vascular surgeons in one month. No further palpitations or fast heart rates. She is  very tired and fatigued. Has good days and bad days. Sometimes she just wants to sleep all day long.  05/31/2016 - 3 months follow-up, the patient is doing well other than orthostatic hypotension when she stands up, since the last visit she had one fall, she now has 24/7 sitter who is watching her. She denies palpitations. She has occasional second lasting epigastric pains when she is at rest, no radiation and no associated dyspnea on exertion. She has developed muscle pain with atorvastatin and is concerned about memory impairment.  09/21/16 - the patient is coming after 3 months, she is accompanied by her daughter, she says that on occasions she would feel dizzy when she stands up, but her daughter has a blood pressure diary and states that her blood pressure goes up to 160. She denies any chest pain or shortness of breath. She has rare lower extremity edema, but daughter states that previously with diuretics she became dehydrated and had multiple syncopal episodes. She hasn't had recent fall. Her family fatigued and sleeps most of her day. No bleeding with Elliquis.   Past Medical History:  Diagnosis Date  . Anxiety   . Depression   . Diabetes (HCC)   . Headache 06/09/2015  . High cholesterol   . Hypertension   . Macular degeneration   . Migraine   . Syncope and collapse 06/09/2015    Past Surgical History:  Procedure Laterality Date  . ABDOMINAL HYSTERECTOMY    . APPENDECTOMY    . CAROTID ARTERY ANGIOPLASTY  Right   . CATARACT EXTRACTION Bilateral   . LUMBAR LAMINECTOMY      Current Medications: Outpatient Medications Prior to Visit  Medication Sig Dispense Refill  . aspirin EC 81 MG EC tablet Take 1 tablet (81 mg total) by mouth daily.    Marland Kitchen atorvastatin (LIPITOR) 20 MG tablet Take 1 tablet (20 mg total) by mouth daily. 90 tablet 3  . beta carotene 03559 UNIT capsule Take 1 capsule (25,000 Units total) by mouth daily. 90 capsule 0  . beta carotene w/minerals (OCUVITE) tablet Take 1  tablet by mouth daily.    . Cholecalciferol (VITAMIN D-3 PO) Take 5,000 Int'l Units by mouth daily.    Marland Kitchen gabapentin (NEURONTIN) 300 MG capsule Take 1 capsule (300 mg total) by mouth 2 (two) times daily. 60 capsule 0  . isosorbide mononitrate (IMDUR) 30 MG 24 hr tablet Take 30 mg by mouth daily.  3  . LANTUS SOLOSTAR 100 UNIT/ML Solostar Pen Inject 20 Units into the skin daily. 15 mL 0  . levothyroxine (SYNTHROID, LEVOTHROID) 50 MCG tablet TAKE 1 TABLET BY MOUTH DAILY ON EMPTY STOMACH WAIT 1 HOUR BEFORE AKING ANY OTHER MEDS/FOOD/DRINK  5  . metFORMIN (GLUCOPHAGE-XR) 500 MG 24 hr tablet Take 500 mg by mouth 2 (two) times daily with a meal.  3  . metoprolol succinate (TOPROL XL) 25 MG 24 hr tablet Take 0.5 tablets (12.5 mg total) by mouth daily. 45 tablet 3  . pantoprazole (PROTONIX) 40 MG tablet Take 1 tablet (40 mg total) by mouth daily.    . Plant Sterols and Stanols (CHOLESTOFF PO) Take by mouth daily.    Marland Kitchen apixaban (ELIQUIS) 5 MG TABS tablet Take 1 tablet (5 mg total) by mouth 2 (two) times daily. 60 tablet    No facility-administered medications prior to visit.      Allergies:   Patient has no known allergies.   Social History   Social History  . Marital status: Married    Spouse name: Fayrene Fearing  . Number of children: 6  . Years of education: 9   Occupational History  . Retired    Social History Main Topics  . Smoking status: Never Smoker  . Smokeless tobacco: Never Used  . Alcohol use No  . Drug use: No  . Sexual activity: Not Asked   Other Topics Concern  . None   Social History Narrative   Lives at home w/ her husband   Right-handed   Drinks about 2 cups of coffee per day     Family History:  The patient's family history includes Dementia in her brother; Emphysema in her brother; Heart disease in her father; Prostate cancer in her father.   ROS:   Please see the history of present illness.    Review of Systems  Constitution: Positive for weakness and  malaise/fatigue.  HENT: Negative.   Eyes: Negative.   Cardiovascular: Negative.   Respiratory: Negative.   Hematologic/Lymphatic: Negative.   Musculoskeletal: Positive for falls, muscle weakness, myalgias and stiffness. Negative for joint pain.  Gastrointestinal: Negative.   Genitourinary: Negative.   Neurological: Positive for headaches.   All other systems reviewed and are negative.   PHYSICAL EXAM:   VS:  BP 120/62   Pulse 80   Ht 5\' 4"  (1.626 m)   Wt 147 lb (66.7 kg)   BMI 25.23 kg/m   Physical Exam  GEN: Well nourished, well developed, in no acute distress  Neck: no JVD, bilateral carotid bruits Cardiac:RRR; no murmurs,  rubs, or gallops  Respiratory:  Increased breath sounds at the left lung base otherwise clear GI: soft, nontender, nondistended, + BS Ext: without cyanosis, clubbing, or edema, Good distal pulses bilaterally Psych: euthymic mood, full affect  Wt Readings from Last 3 Encounters:  09/21/16 147 lb (66.7 kg)  05/31/16 149 lb (67.6 kg)  04/14/16 151 lb (68.5 kg)      Studies/Labs Reviewed:   EKG:  EKG is ordered today.  The ekg ordered today demonstrates Normal sinus rhythm with first-degree AV block inferior Q waves ST-T wave changes laterally  Recent Labs: 03/02/2016: ALT 18 03/23/2016: Hemoglobin 11.0; Platelets 253 04/14/2016: BUN 7; Creatinine, Ser 0.96; Potassium 4.4; Sodium 141   Lipid Panel    Component Value Date/Time   CHOL 161 03/02/2016 0332   TRIG 172 (H) 03/02/2016 0332   HDL 27 (L) 03/02/2016 0332   CHOLHDL 6.0 03/02/2016 0332   VLDL 34 03/02/2016 0332   LDLCALC 100 (H) 03/02/2016 0332    Additional studies/ records that were reviewed today include:    Echo 03/02/16 LV EF: 55% -   60%   ------------------------------------------------------------------- Indications:      Chest pain 786.51.   ------------------------------------------------------------------- History:   PMH:   Syncope.  Risk factors:  Hypertension.  Diabetes mellitus.   ------------------------------------------------------------------- Study Conclusions   - Left ventricle: The cavity size was normal. Wall thickness was   increased in a pattern of moderate LVH. Systolic function was   normal. The estimated ejection fraction was in the range of 55%   to 60%. There is akinesis of the basalinferior myocardium.   Doppler parameters are consistent with abnormal left ventricular   relaxation (grade 1 diastolic dysfunction). Doppler parameters   are consistent with high ventricular filling pressure. - Mitral valve: There was moderate regurgitation. - Pulmonary arteries: PA peak pressure: 34 mm Hg (S). - Pericardium, extracardiac: A trivial pericardial effusion was   identified.   Impressions:   - Akinesis of the basal inferior wall with overall normal LV   systolic function; grade 1 diastolic dysfunction with elevated LV   filling pressure; moderate LVH; calcified aortic valve with no AS   by doppler; moderate, posterior directed MR; mild TR.   *PRELIMINARY RESULTS* Vascular Ultrasound Carotid Duplex (Doppler) has been completed.   Findings suggest upper range 40-59% versus low range 60-79% right internal carotid artery stenosis and 80-99% left internal carotid artery stenosis. Vertebral arteries are patent with antegrade flow.   Preliminary results discussed with Dr. Randie Heinz.     ASSESSMENT:    1. Hyperlipidemia, unspecified hyperlipidemia type   2. Coronary artery disease due to lipid rich plaque   3. Essential hypertension   4. Lower extremity edema      PLAN:  In order of problems listed above:  1. CAD with recent NSTEMI peak troponins 2.46 treated medically because of the setting of acute CVA, DO NOT RESUSCITATE, age, A. fib and need for carotid surgery. No ischemic workup right now, she is at her baseline and slowly declining with cognitive functions.    2. Ischemic stroke 02/27/16 with 3-4 mm infarct in the right  frontal parietal junction without hemorrhage. CHADSVASC=8. Eliquis and ASA recommended by Neuro. Carotid disease RICA 99% LICA CA and 60-79% R ICA. Vascular surgery prefers medical management considering recent large stroke.  3. A. fib with RVR CHADSVASC=8 patient normal sinus rhythm today. No bleeding with Eliquis.We will continue and refill today.  4. HTN - well controlled, she Can become hypertensive and  time but has episodes of orthostatic hypotension so continue the same management for now.  5. Hyperlipidemia - decrease dose of atorvastatin to 20 mg po daily helped with her myalgias.  Medication Adjustments/Labs and Tests Ordered: Current medicines are reviewed at length with the patient today.  Concerns regarding medicines are outlined above.  Medication changes, Labs and Tests ordered today are listed in the Patient Instructions below. Patient Instructions  Medication Instructions:   Your physician recommends that you continue on your current medications as directed. Please refer to the Current Medication list given to you today.     Follow-Up:  3 MONTHS WITH DR Delton See       If you need a refill on your cardiac medications before your next appointment, please call your pharmacy.      Signed, Tobias Alexander, MD  09/21/2016 1:24 PM    [facility] [address] [place], [place], [place]  [address] Phone: [phone]; Fax: [phone]   0

## 2016-09-21 NOTE — Patient Instructions (Signed)
Medication Instructions:   Your physician recommends that you continue on your current medications as directed. Please refer to the Current Medication list given to you today.     Follow-Up:  3 MONTHS WITH DR NELSON       If you need a refill on your cardiac medications before your next appointment, please call your pharmacy.   

## 2016-10-20 ENCOUNTER — Encounter (HOSPITAL_COMMUNITY): Payer: Medicare Other

## 2016-10-20 ENCOUNTER — Ambulatory Visit: Payer: Medicare Other | Admitting: Vascular Surgery

## 2016-11-10 ENCOUNTER — Ambulatory Visit: Payer: Medicare Other | Admitting: Vascular Surgery

## 2016-11-10 ENCOUNTER — Encounter (HOSPITAL_COMMUNITY): Payer: Medicare Other

## 2016-11-22 ENCOUNTER — Encounter (HOSPITAL_COMMUNITY): Payer: Medicare Other

## 2016-11-22 ENCOUNTER — Ambulatory Visit: Payer: Medicare Other | Admitting: Vascular Surgery

## 2016-12-18 ENCOUNTER — Ambulatory Visit (INDEPENDENT_AMBULATORY_CARE_PROVIDER_SITE_OTHER): Payer: Medicare Other | Admitting: Cardiology

## 2016-12-18 ENCOUNTER — Encounter: Payer: Self-pay | Admitting: Cardiology

## 2016-12-18 ENCOUNTER — Encounter (INDEPENDENT_AMBULATORY_CARE_PROVIDER_SITE_OTHER): Payer: Self-pay

## 2016-12-18 VITALS — BP 132/82 | HR 71 | Ht 64.0 in | Wt 142.0 lb

## 2016-12-18 DIAGNOSIS — I2583 Coronary atherosclerosis due to lipid rich plaque: Secondary | ICD-10-CM | POA: Diagnosis not present

## 2016-12-18 DIAGNOSIS — Z789 Other specified health status: Secondary | ICD-10-CM | POA: Diagnosis not present

## 2016-12-18 DIAGNOSIS — I2510 Atherosclerotic heart disease of native coronary artery without angina pectoris: Secondary | ICD-10-CM | POA: Diagnosis not present

## 2016-12-18 DIAGNOSIS — E785 Hyperlipidemia, unspecified: Secondary | ICD-10-CM

## 2016-12-18 DIAGNOSIS — I214 Non-ST elevation (NSTEMI) myocardial infarction: Secondary | ICD-10-CM

## 2016-12-18 DIAGNOSIS — I639 Cerebral infarction, unspecified: Secondary | ICD-10-CM

## 2016-12-18 MED ORDER — LISINOPRIL 2.5 MG PO TABS: 2.5000 mg | ORAL_TABLET | Freq: Every day | ORAL | 2 refills | Status: DC

## 2016-12-18 MED ORDER — PRAVASTATIN SODIUM 20 MG PO TABS
20.0000 mg | ORAL_TABLET | Freq: Every evening | ORAL | 2 refills | Status: DC
Start: 2016-12-18 — End: 2017-09-11

## 2016-12-18 NOTE — Patient Instructions (Signed)
Medication Instructions:   STOP TAKING ATORVASTATIN NOW  START TAKING PRAVASTATIN 20 MG ONCE DAILY  START TAKING LISINOPRIL 2.5 MG BY MOUTH DAILY AT BEDTIME    Follow-Up:  3 MONTHS WITH DR Delton SeeNELSON       If you need a refill on your cardiac medications before your next appointment, please call your pharmacy.

## 2016-12-18 NOTE — Progress Notes (Signed)
Cardiology Office Note    Date:  12/18/2016   ID:  Valerie Arnold, DOB 19-Mar-1927, MRN [id number]  PCP:  Anselmo Pickler, MD  Cardiologist: Dr. Delton See  Chief complain: 3 months follow up  History of Present Illness:  Valerie Arnold is a 83 y.o. female with PMH significant for DM (insulin dependent), HTN, CKD stage 2, and HLD. No prior CAD. She was transferred here from [facility] with presentation of ischemic stroke on 02/27/2016 (acute 3-4 mm infarct in the right frontoparietal junction without hemorrhage or mass effect). Afib RVR as a new dg on admission to [facility], cardioverted spontaneously into SR with metoprolol, and NSTEMI Troponin 1.52--> 2.46 (6 in [facility]). No chest pain, CVA, DNR, advanced age so managed medically.  2-D echo showed normal LV function, akinesis of the basal inferior wall, grade 1 DD and elevated LV filling pressures, moderate LVH, mild to moderate MR.CHADSVASC=8.  Carotid Dopplers were performed, revealing high-grade left carotid bifurcation stenosis involving the proximal ICA with near occlusion, as well as mild diffuse disease on the right. Vascular surgery at [facility] was consulted regarding the high-grade carotid stenosis, but it was advised that the patient will need to wait at least 2-3 weeks for consideration of surgery given the acute MI and acute stroke.   Patient had a fall in nursing home when she tried to get up in middle of the night. She fell on her left side and broke a rib. She's having a lot of chest pain and shortness of breath when she tries to take a deep breath. She says she wasn't having any chest pain before falling. She denies tightness or pressure. She has to hold her chest to make her feel better. They're talking about discharging her from the nursing home Saturday. She lives at home with her husband but he can't take care of her. They see the vascular surgeons in one month. No further palpitations or fast heart rates. She is  very tired and fatigued. Has good days and bad days. Sometimes she just wants to sleep all day long.  05/31/2016 - 3 months follow-up, the patient is doing well other than orthostatic hypotension when she stands up, since the last visit she had one fall, she now has 24/7 sitter who is watching her. She denies palpitations. She has occasional second lasting epigastric pains when she is at rest, no radiation and no associated dyspnea on exertion. She has developed muscle pain with atorvastatin and is concerned about memory impairment.  09/21/16 - the patient is coming after 3 months, she is accompanied by her daughter, she says that on occasions she would feel dizzy when she stands up, but her daughter has a blood pressure diary and states that her blood pressure goes up to 160. She denies any chest pain or shortness of breath. She has rare lower extremity edema, but daughter states that previously with diuretics she became dehydrated and had multiple syncopal episodes. She hasn't had recent fall. Her family fatigued and sleeps most of her day. No bleeding with Elliquis.  12/18/2016, the patient is coming after 3 months, she denies any chest pain she has stable shortness of breath that is not worse than few months ago. She walks with a walker and has problem with her balance. Her daughter brings a blood pressure diary with blood pressures frequently and 170-200 range but also episodes of blood pressure in 90-100. She was supposed to have angiogram for her severe left carotid stenosis however a  vascular surgeon has been postponing it, currently scheduled for December 21. She denies any palpitations syncope or falls. She complains of generalized muscle pain.   Past Medical History:  Diagnosis Date  . Anxiety   . Depression   . Diabetes (HCC)   . Headache 06/09/2015  . High cholesterol   . Hypertension   . Macular degeneration   . Migraine   . Syncope and collapse 06/09/2015    Past Surgical History:    Procedure Laterality Date  . ABDOMINAL HYSTERECTOMY    . APPENDECTOMY    . CAROTID ARTERY ANGIOPLASTY Right   . CATARACT EXTRACTION Bilateral   . LUMBAR LAMINECTOMY      Current Medications: Outpatient Medications Prior to Visit  Medication Sig Dispense Refill  . apixaban (ELIQUIS) 5 MG TABS tablet Take 1 tablet (5 mg total) by mouth 2 (two) times daily. 180 tablet 2  . aspirin EC 81 MG EC tablet Take 1 tablet (81 mg total) by mouth daily.    . beta carotene w/minerals (OCUVITE) tablet Take 1 tablet by mouth daily.    . Cholecalciferol (VITAMIN D-3 PO) Take 5,000 Int'l Units by mouth daily.    . Ferrous Sulfate (SLOW RELEASE IRON PO) Take 1 tablet every other day by mouth.    . isosorbide mononitrate (IMDUR) 30 MG 24 hr tablet Take 30 mg by mouth daily.  3  . LANTUS SOLOSTAR 100 UNIT/ML Solostar Pen Inject 20 Units into the skin daily. 15 mL 0  . levothyroxine (SYNTHROID, LEVOTHROID) 50 MCG tablet TAKE 1 TABLET BY MOUTH DAILY ON EMPTY STOMACH WAIT 1 HOUR BEFORE AKING ANY OTHER MEDS/FOOD/DRINK  5  . metFORMIN (GLUCOPHAGE-XR) 500 MG 24 hr tablet Take 500 mg by mouth 2 (two) times daily with a meal.  3  . metoprolol succinate (TOPROL XL) 25 MG 24 hr tablet Take 0.5 tablets (12.5 mg total) by mouth daily. 45 tablet 3  . pantoprazole (PROTONIX) 40 MG tablet Take 1 tablet (40 mg total) by mouth daily.    . Phenazopyridine HCl (AZO-STANDARD PO) Take 1 capsule 2 (two) times daily by mouth.    Marland Kitchen. atorvastatin (LIPITOR) 20 MG tablet Take 1 tablet (20 mg total) by mouth daily. 90 tablet 3  . beta carotene [id number] UNIT capsule Take 1 capsule (25,000 Units total) by mouth daily. 90 capsule 0  . gabapentin (NEURONTIN) 300 MG capsule Take 1 capsule (300 mg total) by mouth 2 (two) times daily. 60 capsule 0  . Plant Sterols and Stanols (CHOLESTOFF PO) Take by mouth daily.     No facility-administered medications prior to visit.      Allergies:   Patient has no known allergies.   Social History    Socioeconomic History  . Marital status: Married    Spouse name: Fayrene FearingJames  . Number of children: 6  . Years of education: 239  . Highest education level: None  Social Needs  . Financial resource strain: None  . Food insecurity - worry: None  . Food insecurity - inability: None  . Transportation needs - medical: None  . Transportation needs - non-medical: None  Occupational History  . Occupation: Retired  Tobacco Use  . Smoking status: Never Smoker  . Smokeless tobacco: Never Used  Substance and Sexual Activity  . Alcohol use: No    Alcohol/week: 0.0 oz  . Drug use: No  . Sexual activity: None  Other Topics Concern  . None  Social History Narrative   Lives at home w/ her  husband   Right-handed   Drinks about 2 cups of coffee per day     Family History:  The patient's family history includes Dementia in her brother; Emphysema in her brother; Heart disease in her father; Prostate cancer in her father.   ROS:   Please see the history of present illness.    Review of Systems  Constitution: Positive for weakness and malaise/fatigue.  HENT: Negative.   Eyes: Negative.   Cardiovascular: Negative.   Respiratory: Negative.   Hematologic/Lymphatic: Negative.   Musculoskeletal: Positive for falls, muscle weakness, myalgias and stiffness. Negative for joint pain.  Gastrointestinal: Negative.   Genitourinary: Negative.   Neurological: Positive for headaches.   All other systems reviewed and are negative.   PHYSICAL EXAM:   VS:  BP 132/82   Pulse 71   Ht 5\' 4"  (1.626 m)   Wt 142 lb (64.4 kg)   SpO2 99%   BMI 24.37 kg/m   Physical Exam  GEN: Well nourished, well developed, in no acute distress , somnolent Neck: no JVD, bilateral carotid bruits Cardiac:RRR; 2/6 systolic murmurs, rubs, or gallops  Respiratory:  Increased breath sounds at the left lung base otherwise clear GI: soft, nontender, nondistended, + BS Ext: without cyanosis, clubbing, or edema, Good distal pulses  bilaterally Psych: euthymic mood, full affect  Wt Readings from Last 3 Encounters:  12/18/16 142 lb (64.4 kg)  09/21/16 147 lb (66.7 kg)  05/31/16 149 lb (67.6 kg)      Studies/Labs Reviewed:   EKG:  EKG is ordered today.  The ekg ordered today demonstrates Normal sinus rhythm with first-degree AV block inferior Q waves ST-T wave changes laterally  Recent Labs: 03/02/2016: ALT 18 03/23/2016: Hemoglobin 11.0; Platelets 253 04/14/2016: BUN 7; Creatinine, Ser 0.96; Potassium 4.4; Sodium 141   Lipid Panel    Component Value Date/Time   CHOL 161 03/02/2016 0332   TRIG 172 (H) 03/02/2016 0332   HDL 27 (L) 03/02/2016 0332   CHOLHDL 6.0 03/02/2016 0332   VLDL 34 03/02/2016 0332   LDLCALC 100 (H) 03/02/2016 0332   EKG performed today 12/18/2016 was personally reviewed and shows sinus rhythm with first-degree A-V LOC, incomplete right bundle branch block, anterior MI age undetermined and negative T waves in the lateral leads, this is unchanged from the prior EKG.  Additional studies/ records that were reviewed today include:    Echo 03/02/16 LV EF: 55% -   60%  - Left ventricle: The cavity size was normal. Wall thickness was   increased in a pattern of moderate LVH. Systolic function was   normal. The estimated ejection fraction was in the range of 55%   to 60%. There is akinesis of the basalinferior myocardium.   Doppler parameters are consistent with abnormal left ventricular   relaxation (grade 1 diastolic dysfunction). Doppler parameters   are consistent with high ventricular filling pressure. - Mitral valve: There was moderate regurgitation. - Pulmonary arteries: PA peak pressure: 34 mm Hg (S). - Pericardium, extracardiac: A trivial pericardial effusion was   identified.   Impressions:   - Akinesis of the basal inferior wall with overall normal LV   systolic function; grade 1 diastolic dysfunction with elevated LV   filling pressure; moderate LVH; calcified aortic valve with  no AS   by doppler; moderate, posterior directed MR; mild TR.   *PRELIMINARY RESULTS* Vascular Ultrasound Carotid Duplex (Doppler) has been completed.   Findings suggest upper range 40-59% versus low range 60-79% right internal carotid artery stenosis  and 80-99% left internal carotid artery stenosis. Vertebral arteries are patent with antegrade flow.   Preliminary results discussed with Dr. Randie Heinz.     ASSESSMENT:    1. NSTEMI (non-ST elevated myocardial infarction) (HCC)   2. Hyperlipidemia, unspecified hyperlipidemia type   3. Coronary artery disease due to lipid rich plaque   4. Acute CVA (cerebrovascular accident) (HCC)   5. Statin intolerance      PLAN:  In order of problems listed above:  1. CAD with recent NSTEMI peak troponins 2.46 treated medically because of the setting of acute CVA, DO NOT RESUSCITATE, age, A. fib and need for carotid surgery. No ischemic workup right now, she is at her baseline and slowly declining with cognitive functions. She is currently asymptomatic of chest pain and has stable shortness of breath. We will continue intensive medical management. I will add lisinopril 2.5 mg by mouth daily to be taken at night to avoid early morning blood pressure spikes. I will switch her atorvastatin to pravastatin 20 mg daily because she is experiencing myalgias.   2. Ischemic stroke 02/27/16 with 3-4 mm infarct in the right frontal parietal junction without hemorrhage. CHADSVASC=8. Eliquis and ASA recommended by Neuro. Carotid disease RICA 99% LICA CA and 60-79% R ICA. Vascular surgery prefers medical management considering recent large stroke. She supposed to follow with vascular surgery in December for potential angiogram.  3. A. fib with RVR CHADSVASC=8 patient normal sinus rhythm today. No bleeding with Eliquis.We will continue and refill today.  4. HTN - uncontrolled, I will add lisinopril 2.5 mg daily, daughter will monitor for potential episodes of  hypotension.  5. Hyperlipidemia - switch atorvastatin 2 pravastatin.  Medication Adjustments/Labs and Tests Ordered: Current medicines are reviewed at length with the patient today.  Concerns regarding medicines are outlined above.  Medication changes, Labs and Tests ordered today are listed in the Patient Instructions below. Patient Instructions  Medication Instructions:   STOP TAKING ATORVASTATIN NOW  START TAKING PRAVASTATIN 20 MG ONCE DAILY  START TAKING LISINOPRIL 2.5 MG BY MOUTH DAILY AT BEDTIME    Follow-Up:  3 MONTHS WITH DR Delton See       If you need a refill on your cardiac medications before your next appointment, please call your pharmacy.      Signed, Tobias Alexander, MD  12/18/2016 10:40 AM    [facility] [address] [place], [place], [place]  [address] Phone: [phone]; Fax: [phone]

## 2017-01-19 ENCOUNTER — Ambulatory Visit (HOSPITAL_COMMUNITY)
Admission: RE | Admit: 2017-01-19 | Discharge: 2017-01-19 | Disposition: A | Discharge status: Home / Self Care | Payer: Medicare Other | Source: Ambulatory Visit | Attending: Vascular Surgery | Admitting: Vascular Surgery

## 2017-01-19 ENCOUNTER — Encounter: Payer: Self-pay | Admitting: Vascular Surgery

## 2017-01-19 ENCOUNTER — Ambulatory Visit (INDEPENDENT_AMBULATORY_CARE_PROVIDER_SITE_OTHER): Payer: Medicare Other | Admitting: Vascular Surgery

## 2017-01-19 ENCOUNTER — Other Ambulatory Visit: Payer: Self-pay

## 2017-01-19 VITALS — BP 92/63 | HR 78 | Temp 98.3°F | Resp 14 | Ht 66.0 in | Wt 145.0 lb

## 2017-01-19 DIAGNOSIS — I6522 Occlusion and stenosis of left carotid artery: Secondary | ICD-10-CM | POA: Diagnosis not present

## 2017-01-19 LAB — VAS US CAROTID
LCCADDIAS: -11 cm/s
LEFT VERTEBRAL DIAS: -10 cm/s
LICADSYS: -84 cm/s
LICAPDIAS: -69 cm/s
LICAPSYS: -250 cm/s
Left CCA dist sys: -66 cm/s
Left CCA prox dias: 8 cm/s
Left CCA prox sys: 60 cm/s
Left ICA dist dias: -16 cm/s
RCCADSYS: -156 cm/s
RIGHT VERTEBRAL DIAS: 9 cm/s
Right CCA prox sys: 78 cm/s

## 2017-01-19 NOTE — Progress Notes (Signed)
Patient ID: Valerie Arnold, female   DOB: 12/07/1927, 83 y.o.   MRN: [id number]  Reason for Consult: Carotid (6 month f/u)   Referred by Anselmo PicklerAchreja, Manjeet Kaur, *  Subjective:     HPI:  Valerie Arnold is a 83 y.o. female whom I initially saw as an in-hospital consult for right-sided frontoparietal stroke.  She had a clean carotid artery on the right side but the left side demonstrate high-grade stenosis from CTA.  At that time she was discharged to rehab and in my last visit 8 months ago she was very weak not amenable to any procedures.  She is now home but is under 24-hour care.  She remains very weak and appears to have some decline.  [name].  Her risk factors for carotid stenosis include diabetes, high cholesterol, hypertension.  Other than weakness she has no complaints related to today's visit.  She underwent right-sided carotid endarterectomy in [place] multiple years ago.  She takes a statin and aspirin as well as Eliquis daily.  Past Medical History:  Diagnosis Date  . Anxiety   . Depression   . Diabetes (HCC)   . Headache 06/09/2015  . High cholesterol   . Hypertension   . Macular degeneration   . Migraine   . Syncope and collapse 06/09/2015   Family History  Problem Relation Age of Onset  . Prostate cancer Father   . Heart disease Father   . Dementia Brother   . Emphysema Brother    Past Surgical History:  Procedure Laterality Date  . ABDOMINAL HYSTERECTOMY    . APPENDECTOMY    . CAROTID ARTERY ANGIOPLASTY Right   . CATARACT EXTRACTION Bilateral   . LUMBAR LAMINECTOMY      Short Social History:  Social History   Tobacco Use  . Smoking status: Never Smoker  . Smokeless tobacco: Never Used  Substance Use Topics  . Alcohol use: No    Alcohol/week: 0.0 oz    No Known Allergies  Current Outpatient Medications  Medication Sig Dispense Refill  . apixaban (ELIQUIS) 5 MG TABS tablet Take 1 tablet (5 mg total) by mouth 2 (two)  times daily. 180 tablet 2  . aspirin EC 81 MG EC tablet Take 1 tablet (81 mg total) by mouth daily.    . beta carotene w/minerals (OCUVITE) tablet Take 1 tablet by mouth daily.    . Cholecalciferol (VITAMIN D-3 PO) Take 5,000 Int'l Units by mouth daily.    . Ferrous Sulfate (SLOW RELEASE IRON PO) Take 1 tablet every other day by mouth.    . isosorbide mononitrate (IMDUR) 30 MG 24 hr tablet Take 30 mg by mouth daily.  3  . LANTUS SOLOSTAR 100 UNIT/ML Solostar Pen Inject 20 Units into the skin daily. 15 mL 0  . levothyroxine (SYNTHROID, LEVOTHROID) 50 MCG tablet TAKE 1 TABLET BY MOUTH DAILY ON EMPTY STOMACH WAIT 1 HOUR BEFORE AKING ANY OTHER MEDS/FOOD/DRINK  5  . lisinopril (PRINIVIL,ZESTRIL) 2.5 MG tablet Take 1 tablet (2.5 mg total) at bedtime by mouth. 90 tablet 2  . metFORMIN (GLUCOPHAGE-XR) 500 MG 24 hr tablet Take 500 mg by mouth 2 (two) times daily with a meal.  3  . metoprolol succinate (TOPROL XL) 25 MG 24 hr tablet Take 0.5 tablets (12.5 mg total) by mouth daily. 45 tablet 3  . pantoprazole (PROTONIX) 40 MG tablet Take 1 tablet (40 mg total) by mouth daily.    . Phenazopyridine HCl (AZO-STANDARD PO) Take  1 capsule 2 (two) times daily by mouth.    . pravastatin (PRAVACHOL) 20 MG tablet Take 1 tablet (20 mg total) every evening by mouth. 90 tablet 2   No current facility-administered medications for this visit.     Review of Systems  Constitutional: Positive for fatigue.  HENT: HENT negative.  Eyes: Eyes negative.  Respiratory: Respiratory negative.  Cardiovascular: Cardiovascular negative.  GI: Gastrointestinal negative.  Musculoskeletal: Musculoskeletal negative.  Skin: Skin negative.  Neurological: Positive for focal weakness.  Hematologic: Hematologic/lymphatic negative.  Psychiatric:       emotional       Objective:  Objective   Vitals:   01/19/17 1205 01/19/17 1208  BP: 117/64 92/63  Pulse: 78 78  Resp: 14   Temp: 98.3 F (36.8 C)   TempSrc: Oral   SpO2: 98%     Weight: 145 lb (65.8 kg)   Height: 5\' 6"  (1.676 m)    Body mass index is 23.4 kg/m.  Physical Exam  Constitutional: She is oriented to person, place, and time. She appears well-developed.  Eyes: Pupils are equal, round, and reactive to light.  Neck: Normal range of motion.  Cardiovascular: Normal rate.  Pulmonary/Chest: Effort normal.  Abdominal: Soft.  Musculoskeletal: Normal range of motion. She exhibits no edema.  Neurological: She is alert and oriented to person, place, and time. No cranial nerve deficit.  Left grip strength 3/5, right 5/5  Skin: Skin is warm and dry.  Psychiatric: She has a normal mood and affect. Her behavior is normal.    Data: I have independently interpreted her carotid duplex which demonstrates right side 40-59% stenosis with peak systolic velocity to 70.  On the left she is 60-79% stenosis peak systolic velocity to 73 and a large calcified plaque obscuring visualization which may underestimate the velocity.     Assessment/Plan:     83 year old female follows up for carotid stenosis with previous right-sided stroke stenosis on the left having had previous carotid endarterectomy on the right at an outside facility.  This time she is quite weak and is under 24-hour care at home.  I have discussed with her present family members we should potentially not consider any intervention for this high-grade stenosis on the left that is apparent by CTA performed in the hospital.  Present family does not want her to have any intervention.  She will follow-up on a as needed basis.     Maeola HarmanBrandon Christopher Seva Chancy MD [facility]

## 2017-02-13 DIAGNOSIS — E860 Dehydration: Secondary | ICD-10-CM | POA: Diagnosis not present

## 2017-02-13 DIAGNOSIS — N3000 Acute cystitis without hematuria: Secondary | ICD-10-CM

## 2017-02-13 DIAGNOSIS — I10 Essential (primary) hypertension: Secondary | ICD-10-CM | POA: Diagnosis not present

## 2017-02-13 DIAGNOSIS — G9341 Metabolic encephalopathy: Secondary | ICD-10-CM

## 2017-02-13 DIAGNOSIS — E872 Acidosis: Secondary | ICD-10-CM | POA: Diagnosis not present

## 2017-02-13 DIAGNOSIS — R55 Syncope and collapse: Secondary | ICD-10-CM | POA: Diagnosis not present

## 2017-02-14 DIAGNOSIS — N3000 Acute cystitis without hematuria: Secondary | ICD-10-CM | POA: Diagnosis not present

## 2017-02-14 DIAGNOSIS — G9341 Metabolic encephalopathy: Secondary | ICD-10-CM | POA: Diagnosis not present

## 2017-02-14 DIAGNOSIS — I10 Essential (primary) hypertension: Secondary | ICD-10-CM | POA: Diagnosis not present

## 2017-02-14 DIAGNOSIS — R55 Syncope and collapse: Secondary | ICD-10-CM | POA: Diagnosis not present

## 2017-02-14 DIAGNOSIS — E872 Acidosis: Secondary | ICD-10-CM | POA: Diagnosis not present

## 2017-02-15 DIAGNOSIS — E860 Dehydration: Secondary | ICD-10-CM | POA: Diagnosis not present

## 2017-02-15 DIAGNOSIS — N3000 Acute cystitis without hematuria: Secondary | ICD-10-CM | POA: Diagnosis not present

## 2017-02-15 DIAGNOSIS — E872 Acidosis: Secondary | ICD-10-CM | POA: Diagnosis not present

## 2017-02-15 DIAGNOSIS — G9341 Metabolic encephalopathy: Secondary | ICD-10-CM | POA: Diagnosis not present

## 2017-02-15 DIAGNOSIS — I10 Essential (primary) hypertension: Secondary | ICD-10-CM | POA: Diagnosis not present

## 2017-02-15 DIAGNOSIS — R55 Syncope and collapse: Secondary | ICD-10-CM | POA: Diagnosis not present

## 2017-03-19 ENCOUNTER — Ambulatory Visit (INDEPENDENT_AMBULATORY_CARE_PROVIDER_SITE_OTHER): Payer: Medicare Other | Admitting: Cardiology

## 2017-03-19 ENCOUNTER — Encounter: Payer: Self-pay | Admitting: Cardiology

## 2017-03-19 VITALS — BP 100/58 | HR 81 | Ht 65.0 in | Wt 140.8 lb

## 2017-03-19 DIAGNOSIS — I2510 Atherosclerotic heart disease of native coronary artery without angina pectoris: Secondary | ICD-10-CM

## 2017-03-19 DIAGNOSIS — I2583 Coronary atherosclerosis due to lipid rich plaque: Secondary | ICD-10-CM

## 2017-03-19 DIAGNOSIS — E7849 Other hyperlipidemia: Secondary | ICD-10-CM

## 2017-03-19 DIAGNOSIS — I6522 Occlusion and stenosis of left carotid artery: Secondary | ICD-10-CM | POA: Diagnosis not present

## 2017-03-19 DIAGNOSIS — I10 Essential (primary) hypertension: Secondary | ICD-10-CM

## 2017-03-19 DIAGNOSIS — R55 Syncope and collapse: Secondary | ICD-10-CM | POA: Diagnosis not present

## 2017-03-19 MED ORDER — LISINOPRIL 5 MG PO TABS: 5.0000 mg | ORAL_TABLET | Freq: Every day | ORAL | 2 refills | Status: DC

## 2017-03-19 NOTE — Patient Instructions (Signed)
Medication Instructions:   STOP TAKING ASPRIN NOW  STOP TAKING ISOSORBIDE MONONITRATE (IMDUR) NOW  START TAKING LISINOPRIL 5 MG ONCE DAILY      Follow-Up:  3 MONTHS WITH DR Delton SeeNELSON       If you need a refill on your cardiac medications before your next appointment, please call your pharmacy.

## 2017-03-19 NOTE — Progress Notes (Signed)
Cardiology Office Note    Date:  03/19/2017   ID:  Valerie Arnold, DOB 07-Feb-1927, MRN [id number]  PCP:  Anselmo Pickler, MD  Cardiologist: Dr. Delton See  Chief complain: 3 months follow up  History of Present Illness:  Valerie Arnold is a 84 y.o. female with PMH significant for DM (insulin dependent), HTN, CKD stage 2, and HLD. No prior CAD. She was transferred here from [facility] with presentation of ischemic stroke on 02/27/2016 (acute 3-4 mm infarct in the right frontoparietal junction without hemorrhage or mass effect). Afib RVR as a new dg on admission to [facility], cardioverted spontaneously into SR with metoprolol, and NSTEMI Troponin 1.52--> 2.46 (6 in [facility]). No chest pain, CVA, DNR, advanced age so managed medically.  2-D echo showed normal LV function, akinesis of the basal inferior wall, grade 1 DD and elevated LV filling pressures, moderate LVH, mild to moderate MR.CHADSVASC=8.  Carotid Dopplers were performed, revealing high-grade left carotid bifurcation stenosis involving the proximal ICA with near occlusion, as well as mild diffuse disease on the right. Vascular surgery at [facility] was consulted regarding the high-grade carotid stenosis, but it was advised that the patient will need to wait at least 2-3 weeks for consideration of surgery given the acute MI and acute stroke.   Patient had a fall in nursing home when she tried to get up in middle of the night. She fell on her left side and broke a rib. She's having a lot of chest pain and shortness of breath when she tries to take a deep breath. She says she wasn't having any chest pain before falling. She denies tightness or pressure. She has to hold her chest to make her feel better. They're talking about discharging her from the nursing home Saturday. She lives at home with her husband but he can't take care of her. They see the vascular surgeons in one month. No further palpitations or fast heart rates. She is  very tired and fatigued. Has good days and bad days. Sometimes she just wants to sleep all day long.  05/31/2016 - 3 months follow-up, the patient is doing well other than orthostatic hypotension when she stands up, since the last visit she had one fall, she now has 24/7 sitter who is watching her. She denies palpitations. She has occasional second lasting epigastric pains when she is at rest, no radiation and no associated dyspnea on exertion. She has developed muscle pain with atorvastatin and is concerned about memory impairment.  09/21/16 - the patient is coming after 3 months, she is accompanied by her daughter, she says that on occasions she would feel dizzy when she stands up, but her daughter has a blood pressure diary and states that her blood pressure goes up to 160. She denies any chest pain or shortness of breath. She has rare lower extremity edema, but daughter states that previously with diuretics she became dehydrated and had multiple syncopal episodes. She hasn't had recent fall. Her family fatigued and sleeps most of her day. No bleeding with Elliquis.  12/18/2016, the patient is coming after 3 months, she denies any chest pain she has stable shortness of breath that is not worse than few months ago. She walks with a walker and has problem with her balance. Her daughter brings a blood pressure diary with blood pressures frequently and 170-200 range but also episodes of blood pressure in 90-100. She was supposed to have angiogram for her severe left carotid stenosis however a  vascular surgeon has been postponing it, currently scheduled for December 21. She denies any palpitations syncope or falls. She complains of generalized muscle pain.  03/19/2017 - 3 months follow up, she was hospitalized in January 2019 with UTI/dehydrtion/presyncope where her husband prevented fall. She has improved since then including her mental status. Her BPs have been all over - with several in 90', most in 120-160  range and few in 200. Most elevated BP in the evening. No chest pain or SOB, walks with a walker.  Past Medical History:  Diagnosis Date  . Anxiety   . Depression   . Diabetes (HCC)   . Headache 06/09/2015  . High cholesterol   . Hypertension   . Macular degeneration   . Migraine   . Syncope and collapse 06/09/2015    Past Surgical History:  Procedure Laterality Date  . ABDOMINAL HYSTERECTOMY    . APPENDECTOMY    . CAROTID ARTERY ANGIOPLASTY Right   . CATARACT EXTRACTION Bilateral   . LUMBAR LAMINECTOMY      Current Medications: Outpatient Medications Prior to Visit  Medication Sig Dispense Refill  . apixaban (ELIQUIS) 5 MG TABS tablet Take 1 tablet (5 mg total) by mouth 2 (two) times daily. 180 tablet 2  . beta carotene w/minerals (OCUVITE) tablet Take 1 tablet by mouth daily.    . Cholecalciferol (VITAMIN D-3 PO) Take 5,000 Int'l Units by mouth daily.    . Ferrous Sulfate (SLOW RELEASE IRON PO) Take 1 tablet every other day by mouth.    . Insulin Glargine (LANTUS SOLOSTAR) 100 UNIT/ML Solostar Pen Inject 12 Units into the skin daily.    Marland Kitchen levothyroxine (SYNTHROID, LEVOTHROID) 50 MCG tablet TAKE 1 TABLET BY MOUTH DAILY ON EMPTY STOMACH WAIT 1 HOUR BEFORE AKING ANY OTHER MEDS/FOOD/DRINK  5  . metFORMIN (GLUCOPHAGE-XR) 500 MG 24 hr tablet Take 500 mg by mouth 2 (two) times daily with a meal.  3  . metoprolol succinate (TOPROL XL) 25 MG 24 hr tablet Take 0.5 tablets (12.5 mg total) by mouth daily. 45 tablet 3  . pantoprazole (PROTONIX) 40 MG tablet Take 1 tablet (40 mg total) by mouth daily.    . Phenazopyridine HCl (AZO-STANDARD PO) Take 1 capsule 2 (two) times daily by mouth.    Marland Kitchen aspirin EC 81 MG EC tablet Take 1 tablet (81 mg total) by mouth daily.    . isosorbide mononitrate (IMDUR) 30 MG 24 hr tablet Take 30 mg by mouth daily.  3  . pravastatin (PRAVACHOL) 20 MG tablet Take 1 tablet (20 mg total) every evening by mouth. 90 tablet 2  . LANTUS SOLOSTAR 100 UNIT/ML Solostar Pen  Inject 20 Units into the skin daily. (Patient not taking: Reported on 03/19/2017) 15 mL 0  . lisinopril (PRINIVIL,ZESTRIL) 2.5 MG tablet Take 1 tablet (2.5 mg total) at bedtime by mouth. 90 tablet 2   No facility-administered medications prior to visit.      Allergies:   Patient has no known allergies.   Social History   Socioeconomic History  . Marital status: Married    Spouse name: Fayrene Fearing  . Number of children: 6  . Years of education: 33  . Highest education level: None  Social Needs  . Financial resource strain: None  . Food insecurity - worry: None  . Food insecurity - inability: None  . Transportation needs - medical: None  . Transportation needs - non-medical: None  Occupational History  . Occupation: Retired  Tobacco Use  . Smoking status:  Never Smoker  . Smokeless tobacco: Never Used  Substance and Sexual Activity  . Alcohol use: No    Alcohol/week: 0.0 oz  . Drug use: No  . Sexual activity: None  Other Topics Concern  . None  Social History Narrative   Lives at home w/ her husband   Right-handed   Drinks about 2 cups of coffee per day     Family History:  The patient's family history includes Dementia in her brother; Emphysema in her brother; Heart disease in her father; Prostate cancer in her father.   ROS:   Please see the history of present illness.    Review of Systems  Constitution: Positive for weakness and malaise/fatigue.  HENT: Negative.   Eyes: Negative.   Cardiovascular: Negative.   Respiratory: Negative.   Hematologic/Lymphatic: Negative.   Musculoskeletal: Positive for falls, muscle weakness, myalgias and stiffness. Negative for joint pain.  Gastrointestinal: Negative.   Genitourinary: Negative.   Neurological: Positive for headaches.   All other systems reviewed and are negative.   PHYSICAL EXAM:   VS:  BP (!) 100/58   Pulse 81   Ht 5\' 5"  (1.651 m)   Wt 140 lb 12.8 oz (63.9 kg)   BMI 23.43 kg/m   Physical Exam  GEN: Well  nourished, well developed, in no acute distress , somnolent Neck: no JVD, bilateral carotid bruits Cardiac:RRR; 2/6 systolic murmurs, rubs, or gallops  Respiratory:  Increased breath sounds at the left lung base otherwise clear GI: soft, nontender, nondistended, + BS Ext: without cyanosis, clubbing, or edema, Good distal pulses bilaterally Psych: euthymic mood, full affect  Wt Readings from Last 3 Encounters:  03/19/17 140 lb 12.8 oz (63.9 kg)  01/19/17 145 lb (65.8 kg)  12/18/16 142 lb (64.4 kg)      Studies/Labs Reviewed:   EKG:  EKG is ordered today.  The ekg ordered today demonstrates Normal sinus rhythm with first-degree AV block inferior Q waves ST-T wave changes laterally  Recent Labs: 03/23/2016: Hemoglobin 11.0; Platelets 253 04/14/2016: BUN 7; Creatinine, Ser 0.96; Potassium 4.4; Sodium 141   Lipid Panel    Component Value Date/Time   CHOL 161 03/02/2016 0332   TRIG 172 (H) 03/02/2016 0332   HDL 27 (L) 03/02/2016 0332   CHOLHDL 6.0 03/02/2016 0332   VLDL 34 03/02/2016 0332   LDLCALC 100 (H) 03/02/2016 0332   EKG performed today 12/18/2016 was personally reviewed and shows sinus rhythm with first-degree A-V LOC, incomplete right bundle branch block, anterior MI age undetermined and negative T waves in the lateral leads, this is unchanged from the prior EKG.  Additional studies/ records that were reviewed today include:    Echo 03/02/16 LV EF: 55% -   60%  - Left ventricle: The cavity size was normal. Wall thickness was   increased in a pattern of moderate LVH. Systolic function was   normal. The estimated ejection fraction was in the range of 55%   to 60%. There is akinesis of the basalinferior myocardium.   Doppler parameters are consistent with abnormal left ventricular   relaxation (grade 1 diastolic dysfunction). Doppler parameters   are consistent with high ventricular filling pressure. - Mitral valve: There was moderate regurgitation. - Pulmonary arteries: PA  peak pressure: 34 mm Hg (S). - Pericardium, extracardiac: A trivial pericardial effusion was   identified.   Impressions:   - Akinesis of the basal inferior wall with overall normal LV   systolic function; grade 1 diastolic dysfunction with elevated LV  filling pressure; moderate LVH; calcified aortic valve with no AS   by doppler; moderate, posterior directed MR; mild TR.  Vascular Ultrasound Carotid Duplex (Doppler) has been completed.   Findings suggest upper range 40-59% versus low range 60-79% right internal carotid artery stenosis and 80-99% left internal carotid artery stenosis. Vertebral arteries are patent with antegrade flow.   ASSESSMENT:    1. Coronary artery disease due to lipid rich plaque   2. Essential hypertension    PLAN:  In order of problems listed above:  1. CAD with NSTEMI in 03/2016, peak troponins 2.46 treated medically because of the setting of acute CVA, DO NOT RESUSCITATE, age, A. fib. No ischemic workup as she is asymptomatic, continue pravastatin 20 mg daily, discontinue ASA as she is on Eliquis.   2. Ischemic stroke 02/27/16 with 3-4 mm infarct in the right frontal parietal junction without hemorrhage. CHADSVASC=8. Eliquis and ASA recommended by Neuro. Carotid disease RICA 99% LICA CA and 60-79% R ICA. Vascular surgery prefers medical management considering recent large stroke.   3. A. fib with RVR CHADSVASC=8 patient normal sinus rhythm today. No bleeding with Eliquis.  4. HTN - d/c imdur as there are episodes of hypotension, increase lisinopril to 5 mg po daily.  5. Hyperlipidemia - continue pravastatin.  Medication Adjustments/Labs and Tests Ordered: Current medicines are reviewed at length with the patient today.  Concerns regarding medicines are outlined above.  Medication changes, Labs and Tests ordered today are listed in the Patient Instructions below. Patient Instructions  Medication Instructions:   STOP TAKING ASPRIN NOW  STOP TAKING  ISOSORBIDE MONONITRATE (IMDUR) NOW  START TAKING LISINOPRIL 5 MG ONCE DAILY      Follow-Up:  3 MONTHS WITH DR Delton SeeNELSON       If you need a refill on your cardiac medications before your next appointment, please call your pharmacy.      Signed, Tobias AlexanderKatarina Abel Ra, MD  03/19/2017 11:12 PM    [facility] [address], [place], [place]  [address] Phone: [phone]; Fax: [phone]

## 2017-04-10 ENCOUNTER — Other Ambulatory Visit: Payer: Self-pay | Admitting: Physician Assistant

## 2017-07-11 ENCOUNTER — Encounter: Payer: Self-pay | Admitting: Cardiology

## 2017-07-11 ENCOUNTER — Ambulatory Visit (INDEPENDENT_AMBULATORY_CARE_PROVIDER_SITE_OTHER): Payer: Medicare Other | Admitting: Cardiology

## 2017-07-11 ENCOUNTER — Encounter (INDEPENDENT_AMBULATORY_CARE_PROVIDER_SITE_OTHER): Payer: Self-pay

## 2017-07-11 VITALS — BP 134/70 | HR 70 | Ht 65.0 in | Wt 142.4 lb

## 2017-07-11 DIAGNOSIS — I2583 Coronary atherosclerosis due to lipid rich plaque: Secondary | ICD-10-CM

## 2017-07-11 DIAGNOSIS — R072 Precordial pain: Secondary | ICD-10-CM

## 2017-07-11 DIAGNOSIS — I10 Essential (primary) hypertension: Secondary | ICD-10-CM | POA: Diagnosis not present

## 2017-07-11 DIAGNOSIS — I2510 Atherosclerotic heart disease of native coronary artery without angina pectoris: Secondary | ICD-10-CM

## 2017-07-11 DIAGNOSIS — I6522 Occlusion and stenosis of left carotid artery: Secondary | ICD-10-CM

## 2017-07-11 LAB — COMPREHENSIVE METABOLIC PANEL
ALT: 11 IU/L (ref 0–32)
AST: 16 IU/L (ref 0–40)
Albumin/Globulin Ratio: 2.1 (ref 1.2–2.2)
Albumin: 4.5 g/dL (ref 3.5–4.7)
Alkaline Phosphatase: 46 IU/L (ref 39–117)
BUN/Creatinine Ratio: 29 — ABNORMAL HIGH (ref 12–28)
BUN: 23 mg/dL (ref 8–27)
Bilirubin Total: 0.7 mg/dL (ref 0.0–1.2)
CO2: 26 mmol/L (ref 20–29)
Calcium: 10.2 mg/dL (ref 8.7–10.3)
Chloride: 94 mmol/L — ABNORMAL LOW (ref 96–106)
Creatinine, Ser: 0.79 mg/dL (ref 0.57–1.00)
GFR calc Af Amer: 78 mL/min/{1.73_m2} (ref >59)
GFR calc non Af Amer: 68 mL/min/{1.73_m2} (ref >59)
Globulin, Total: 2.1 g/dL (ref 1.5–4.5)
Glucose: 105 mg/dL — ABNORMAL HIGH (ref 65–99)
Potassium: 5.1 mmol/L (ref 3.5–5.2)
Sodium: 135 mmol/L (ref 134–144)
Total Protein: 6.6 g/dL (ref 6.0–8.5)

## 2017-07-11 LAB — CBC WITH DIFFERENTIAL/PLATELET
Basophils Absolute: 0 10*3/uL (ref 0.0–0.2)
Basos: 0 %
EOS (ABSOLUTE): 0.2 10*3/uL (ref 0.0–0.4)
Eos: 2 %
Hematocrit: 37.8 % (ref 34.0–46.6)
Hemoglobin: 12.6 g/dL (ref 11.1–15.9)
Immature Grans (Abs): 0.1 10*3/uL (ref 0.0–0.1)
Immature Granulocytes: 1 %
Lymphocytes Absolute: 4 10*3/uL — ABNORMAL HIGH (ref 0.7–3.1)
Lymphs: 37 %
MCH: 30.8 pg (ref 26.6–33.0)
MCHC: 33.3 g/dL (ref 31.5–35.7)
MCV: 92 fL (ref 79–97)
Monocytes Absolute: 0.7 10*3/uL (ref 0.1–0.9)
Monocytes: 7 %
Neutrophils Absolute: 5.7 10*3/uL (ref 1.4–7.0)
Neutrophils: 53 %
Platelets: 297 10*3/uL (ref 150–450)
RBC: 4.09 x10E6/uL (ref 3.77–5.28)
RDW: 13.2 % (ref 12.3–15.4)
WBC: 10.6 10*3/uL (ref 3.4–10.8)

## 2017-07-11 LAB — TSH: TSH: 2.86 u[IU]/mL (ref 0.450–4.500)

## 2017-07-11 MED ORDER — LISINOPRIL 5 MG PO TABS: 5.0000 mg | ORAL_TABLET | Freq: Every day | ORAL | 2 refills | Status: DC

## 2017-07-11 NOTE — Patient Instructions (Addendum)
Medication Instructions:   START TAKING YOUR LISINOPRIL 5 MG BY MOUTH DAILY AT BEDTIME    Labwork:  TODAY--CMET, CBC W DIFF, AND TSH    Follow-Up:  Your physician wants you to follow-up in: 4 MONTHS WITH DR Johnell ComingsNELSON You will receive a reminder letter in the mail two months in advance. If you don't receive a letter, please call our office to schedule the follow-up appointment.        If you need a refill on your cardiac medications before your next appointment, please call your pharmacy.

## 2017-07-11 NOTE — Progress Notes (Signed)
Cardiology Office Note    Date:  07/12/2017   ID:  Rendi Mapel, DOB Aug 14, 1927, MRN [id number]  PCP:  Anselmo Pickler, MD  Cardiologist: Dr. Delton See  Chief complain: 3 months follow up  History of Present Illness:  Valerie Arnold is a 84 y.o. female with PMH significant for DM (insulin dependent), HTN, CKD stage 2, and HLD. No prior CAD. She was transferred here from [facility] with presentation of ischemic stroke on 02/27/2016 (acute 3-4 mm infarct in the right frontoparietal junction without hemorrhage or mass effect). Afib RVR as a new dg on admission to [facility], cardioverted spontaneously into SR with metoprolol, and NSTEMI Troponin 1.52--> 2.46 (6 in [facility]). No chest pain, CVA, DNR, advanced age so managed medically.  2-D echo showed normal LV function, akinesis of the basal inferior wall, grade 1 DD and elevated LV filling pressures, moderate LVH, mild to moderate MR.CHADSVASC=8.  Carotid Dopplers were performed, revealing high-grade left carotid bifurcation stenosis involving the proximal ICA with near occlusion, as well as mild diffuse disease on the right. Vascular surgery at [facility] was consulted regarding the high-grade carotid stenosis, but it was advised that the patient will need to wait at least 2-3 weeks for consideration of surgery given the acute MI and acute stroke.   Patient had a fall in nursing home when she tried to get up in middle of the night. She fell on her left side and broke a rib. She's having a lot of chest pain and shortness of breath when she tries to take a deep breath. She says she wasn't having any chest pain before falling. She denies tightness or pressure. She has to hold her chest to make her feel better. They're talking about discharging her from the nursing home Saturday. She lives at home with her husband but he can't take care of her. They see the vascular surgeons in one month. No further palpitations or fast heart rates. She is  very tired and fatigued. Has good days and bad days. Sometimes she just wants to sleep all day long.  05/31/2016 - 3 months follow-up, the patient is doing well other than orthostatic hypotension when she stands up, since the last visit she had one fall, she now has 24/7 sitter who is watching her. She denies palpitations. She has occasional second lasting epigastric pains when she is at rest, no radiation and no associated dyspnea on exertion. She has developed muscle pain with atorvastatin and is concerned about memory impairment.  09/21/16 - the patient is coming after 3 months, she is accompanied by her daughter, she says that on occasions she would feel dizzy when she stands up, but her daughter has a blood pressure diary and states that her blood pressure goes up to 160. She denies any chest pain or shortness of breath. She has rare lower extremity edema, but daughter states that previously with diuretics she became dehydrated and had multiple syncopal episodes. She hasn't had recent fall. Her family fatigued and sleeps most of her day. No bleeding with Elliquis.  12/18/2016, the patient is coming after 3 months, she denies any chest pain she has stable shortness of breath that is not worse than few months ago. She walks with a walker and has problem with her balance. Her daughter brings a blood pressure diary with blood pressures frequently and 170-200 range but also episodes of blood pressure in 90-100. She was supposed to have angiogram for her severe left carotid stenosis however a  vascular surgeon has been postponing it, currently scheduled for December 21. She denies any palpitations syncope or falls. She complains of generalized muscle pain.  03/19/2017 - 3 months follow up, she was hospitalized in January 2019 with UTI/dehydrtion/presyncope where her husband prevented fall. She has improved since then including her mental status. Her BPs have been all over - with several in 90', most in 120-160  range and few in 200. Most elevated BP in the evening. No chest pain or SOB, walks with a walker.  07/09/2017 - the patient comes accompanied by a daughter, the patient is somnolent and falling asleep in the room. The daughter states that she has been suffering from UTIs, her mental status fluctuates with it, on and off ATB for the last few months. No LE edema, orthopnea or PND, however BO fluctuates significantly when having UTI, 90' in the am and 150-180 in the evening, no falls.   Past Medical History:  Diagnosis Date  . Anxiety   . Depression   . Diabetes (HCC)   . Headache 06/09/2015  . High cholesterol   . Hypertension   . Macular degeneration   . Migraine   . Syncope and collapse 06/09/2015    Past Surgical History:  Procedure Laterality Date  . ABDOMINAL HYSTERECTOMY    . APPENDECTOMY    . CAROTID ARTERY ANGIOPLASTY Right   . CATARACT EXTRACTION Bilateral   . LUMBAR LAMINECTOMY      Current Medications: Outpatient Medications Prior to Visit  Medication Sig Dispense Refill  . apixaban (ELIQUIS) 5 MG TABS tablet Take 1 tablet (5 mg total) by mouth 2 (two) times daily. 180 tablet 2  . beta carotene w/minerals (OCUVITE) tablet Take 1 tablet by mouth daily.    . Cholecalciferol (VITAMIN D-3 PO) Take 5,000 Int'l Units by mouth daily.    . Ferrous Sulfate (SLOW RELEASE IRON PO) Take 1 tablet every other day by mouth.    . Insulin Glargine (LANTUS SOLOSTAR) 100 UNIT/ML Solostar Pen Inject 12 Units into the skin daily.    Marland Kitchen levothyroxine (SYNTHROID, LEVOTHROID) 50 MCG tablet TAKE 1 TABLET BY MOUTH DAILY ON EMPTY STOMACH WAIT 1 HOUR BEFORE AKING ANY OTHER MEDS/FOOD/DRINK  5  . metFORMIN (GLUCOPHAGE-XR) 500 MG 24 hr tablet Take 500 mg by mouth 2 (two) times daily with a meal.  3  . metoprolol succinate (TOPROL-XL) 25 MG 24 hr tablet TAKE 1/2 TABLET BY MOUTH EVERY DAY 45 tablet 3  . nitrofurantoin (MACRODANTIN) 50 MG capsule Take 50 mg by mouth daily.     . pantoprazole (PROTONIX) 40  MG tablet Take 1 tablet (40 mg total) by mouth daily.    . Phenazopyridine HCl (AZO-STANDARD PO) Take 1 capsule 2 (two) times daily by mouth.    . pravastatin (PRAVACHOL) 20 MG tablet Take 1 tablet (20 mg total) every evening by mouth. 90 tablet 2  . lisinopril (PRINIVIL,ZESTRIL) 5 MG tablet Take 1 tablet (5 mg total) by mouth daily. 90 tablet 2   No facility-administered medications prior to visit.      Allergies:   Patient has no known allergies.   Social History   Socioeconomic History  . Marital status: Married    Spouse name: Fayrene Fearing  . Number of children: 6  . Years of education: 46  . Highest education level: Not on file  Occupational History  . Occupation: Retired  Engineer, production  . Financial resource strain: Not on file  . Food insecurity:    Worry: Not on  file    Inability: Not on file  . Transportation needs:    Medical: Not on file    Non-medical: Not on file  Tobacco Use  . Smoking status: Never Smoker  . Smokeless tobacco: Never Used  Substance and Sexual Activity  . Alcohol use: No    Alcohol/week: 0.0 oz  . Drug use: No  . Sexual activity: Not on file  Lifestyle  . Physical activity:    Days per week: Not on file    Minutes per session: Not on file  . Stress: Not on file  Relationships  . Social connections:    Talks on phone: Not on file    Gets together: Not on file    Attends religious service: Not on file    Active member of club or organization: Not on file    Attends meetings of clubs or organizations: Not on file    Relationship status: Not on file  Other Topics Concern  . Not on file  Social History Narrative   Lives at home w/ her husband   Right-handed   Drinks about 2 cups of coffee per day     Family History:  The patient's family history includes Dementia in her brother; Emphysema in her brother; Heart disease in her father; Prostate cancer in her father.   ROS:   Please see the history of present illness.    Review of Systems    Constitution: Positive for malaise/fatigue.  HENT: Negative.   Eyes: Negative.   Cardiovascular: Negative.   Respiratory: Negative.   Hematologic/Lymphatic: Negative.   Musculoskeletal: Positive for falls, muscle weakness, myalgias and stiffness. Negative for joint pain.  Gastrointestinal: Negative.   Genitourinary: Negative.   Neurological: Positive for headaches and weakness.   All other systems reviewed and are negative.  PHYSICAL EXAM:   VS:  BP 134/70   Pulse 70   Ht 5\' 5"  (1.651 m)   Wt 142 lb 6.4 oz (64.6 kg)   SpO2 (!) 87%   BMI 23.70 kg/m   Physical Exam  GEN: Well nourished, well developed, in no acute distress , somnolent Neck: no JVD, bilateral carotid bruits Cardiac:RRR; 2/6 systolic murmurs, rubs, or gallops  Respiratory:  Increased breath sounds at the left lung base otherwise clear GI: soft, nontender, nondistended, + BS Ext: without cyanosis, clubbing, or edema, Good distal pulses bilaterally Psych: euthymic mood, full affect  Wt Readings from Last 3 Encounters:  07/11/17 142 lb 6.4 oz (64.6 kg)  03/19/17 140 lb 12.8 oz (63.9 kg)  01/19/17 145 lb (65.8 kg)      Studies/Labs Reviewed:   EKG:  EKG is ordered today.  The ekg ordered today demonstrates Normal sinus rhythm with first-degree AV block inferior Q waves ST-T wave changes laterally  Recent Labs: 07/11/2017: ALT 11; BUN 23; Creatinine, Ser 0.79; Hemoglobin 12.6; Platelets 297; Potassium 5.1; Sodium 135; TSH 2.860   Lipid Panel    Component Value Date/Time   CHOL 161 03/02/2016 0332   TRIG 172 (H) 03/02/2016 0332   HDL 27 (L) 03/02/2016 0332   CHOLHDL 6.0 03/02/2016 0332   VLDL 34 03/02/2016 0332   LDLCALC 100 (H) 03/02/2016 0332   EKG performed today 12/18/2016 was personally reviewed and shows sinus rhythm with first-degree A-V LOC, incomplete right bundle branch block, anterior MI age undetermined and negative T waves in the lateral leads, this is unchanged from the prior  EKG.  Additional studies/ records that were reviewed today include:  Echo 03/02/16 LV EF: 55% -   60%  - Left ventricle: The cavity size was normal. Wall thickness was   increased in a pattern of moderate LVH. Systolic function was   normal. The estimated ejection fraction was in the range of 55%   to 60%. There is akinesis of the basalinferior myocardium.   Doppler parameters are consistent with abnormal left ventricular   relaxation (grade 1 diastolic dysfunction). Doppler parameters   are consistent with high ventricular filling pressure. - Mitral valve: There was moderate regurgitation. - Pulmonary arteries: PA peak pressure: 34 mm Hg (S). - Pericardium, extracardiac: A trivial pericardial effusion was   identified.   Impressions:   - Akinesis of the basal inferior wall with overall normal LV   systolic function; grade 1 diastolic dysfunction with elevated LV   filling pressure; moderate LVH; calcified aortic valve with no AS   by doppler; moderate, posterior directed MR; mild TR.  Vascular Ultrasound Carotid Duplex (Doppler) has been completed.   Findings suggest upper range 40-59% versus low range 60-79% right internal carotid artery stenosis and 80-99% left internal carotid artery stenosis. Vertebral arteries are patent with antegrade flow.   ASSESSMENT:    1. Essential hypertension   2. Coronary artery disease due to lipid rich plaque   3. Precordial pain    PLAN:  In order of problems listed above:  1. CAD with NSTEMI in 03/2016, peak troponins 2.46 treated medically because of the setting of acute CVA, DO NOT RESUSCITATE, age, A. fib. No ischemic workup as she is asymptomatic, continue pravastatin 20 mg daily, discontinue ASA as she is on Eliquis. ECG today is unchanged.   2. Ischemic stroke 02/27/16 with 3-4 mm infarct in the right frontal parietal junction without hemorrhage. CHADSVASC=8. Eliquis and ASA recommended by Neuro. Carotid disease RICA 99% LICA CA and  60-79% R ICA. Vascular surgery prefers medical management considering recent large stroke.   3. A. fib with RVR CHADSVASC=8 patient normal sinus rhythm today. No bleeding with Eliquis, no falls.  4. HTN - take toprol 25 mg po daily in the am and lisinopril 5 mg po QHS to avoid morning hypotension and evening hypertension.  5. Hyperlipidemia - continue pravastatin.  Medication Adjustments/Labs and Tests Ordered: Current medicines are reviewed at length with the patient today.  Concerns regarding medicines are outlined above.  Medication changes, Labs and Tests ordered today are listed in the Patient Instructions below. Patient Instructions  Medication Instructions:   START TAKING YOUR LISINOPRIL 5 MG BY MOUTH DAILY AT BEDTIME    Labwork:  TODAY--CMET, CBC W DIFF, AND TSH    Follow-Up:  Your physician wants you to follow-up in: 4 MONTHS WITH DR Johnell ComingsNELSON You will receive a reminder letter in the mail two months in advance. If you don't receive a letter, please call our office to schedule the follow-up appointment.        If you need a refill on your cardiac medications before your next appointment, please call your pharmacy.      Signed, Tobias AlexanderKatarina Grant Henkes, MD  07/12/2017 12:27 AM    [facility] [address], [place], [place]  [address] Phone: [phone]) [phone]; Fax: [phone]

## 2017-09-11 ENCOUNTER — Other Ambulatory Visit: Payer: Self-pay | Admitting: Cardiology

## 2017-09-11 DIAGNOSIS — I2583 Coronary atherosclerosis due to lipid rich plaque: Secondary | ICD-10-CM

## 2017-09-11 DIAGNOSIS — E785 Hyperlipidemia, unspecified: Secondary | ICD-10-CM

## 2017-09-11 DIAGNOSIS — I214 Non-ST elevation (NSTEMI) myocardial infarction: Secondary | ICD-10-CM

## 2017-09-11 DIAGNOSIS — I639 Cerebral infarction, unspecified: Secondary | ICD-10-CM

## 2017-09-11 DIAGNOSIS — I2510 Atherosclerotic heart disease of native coronary artery without angina pectoris: Secondary | ICD-10-CM

## 2017-10-04 ENCOUNTER — Other Ambulatory Visit: Payer: Self-pay | Admitting: *Deleted

## 2017-10-04 MED ORDER — APIXABAN 5 MG PO TABS: 5.0000 mg | ORAL_TABLET | Freq: Two times a day (BID) | ORAL | 9 refills | Status: DC

## 2017-10-09 ENCOUNTER — Other Ambulatory Visit: Payer: Self-pay | Admitting: *Deleted

## 2017-10-09 MED ORDER — APIXABAN 5 MG PO TABS: 5.0000 mg | ORAL_TABLET | Freq: Two times a day (BID) | ORAL | 2 refills | Status: DC

## 2017-10-09 NOTE — Telephone Encounter (Signed)
Pt has requested a refill for a 90 day supply of her Eliquis 5mg  and 2 refills; refill was sent on 10/04/17, 30 day supply with refills; pt is 84 yrs old, wt-64.6kg, Crea-0.79 on 07/11/17, last seen by Dr. Delton See on 07/11/17; will send in refill to requested pharmacy for 90 day supply & requested refills.

## 2017-10-22 DIAGNOSIS — E039 Hypothyroidism, unspecified: Secondary | ICD-10-CM

## 2017-10-22 DIAGNOSIS — R4182 Altered mental status, unspecified: Secondary | ICD-10-CM

## 2017-10-22 DIAGNOSIS — I10 Essential (primary) hypertension: Secondary | ICD-10-CM | POA: Diagnosis not present

## 2017-10-22 DIAGNOSIS — K5732 Diverticulitis of large intestine without perforation or abscess without bleeding: Secondary | ICD-10-CM

## 2017-10-22 DIAGNOSIS — R739 Hyperglycemia, unspecified: Secondary | ICD-10-CM | POA: Diagnosis not present

## 2017-10-22 DIAGNOSIS — G459 Transient cerebral ischemic attack, unspecified: Secondary | ICD-10-CM

## 2017-10-22 DIAGNOSIS — E871 Hypo-osmolality and hyponatremia: Secondary | ICD-10-CM

## 2017-10-23 DIAGNOSIS — I340 Nonrheumatic mitral (valve) insufficiency: Secondary | ICD-10-CM | POA: Diagnosis not present

## 2017-10-23 DIAGNOSIS — K5732 Diverticulitis of large intestine without perforation or abscess without bleeding: Secondary | ICD-10-CM | POA: Diagnosis not present

## 2017-10-23 DIAGNOSIS — R4182 Altered mental status, unspecified: Secondary | ICD-10-CM | POA: Diagnosis not present

## 2017-10-23 DIAGNOSIS — I10 Essential (primary) hypertension: Secondary | ICD-10-CM | POA: Diagnosis not present

## 2017-10-23 DIAGNOSIS — I517 Cardiomegaly: Secondary | ICD-10-CM

## 2017-10-23 DIAGNOSIS — R739 Hyperglycemia, unspecified: Secondary | ICD-10-CM | POA: Diagnosis not present

## 2017-10-24 DIAGNOSIS — K219 Gastro-esophageal reflux disease without esophagitis: Secondary | ICD-10-CM | POA: Diagnosis not present

## 2017-10-24 DIAGNOSIS — D72829 Elevated white blood cell count, unspecified: Secondary | ICD-10-CM

## 2017-10-24 DIAGNOSIS — E785 Hyperlipidemia, unspecified: Secondary | ICD-10-CM

## 2017-10-24 DIAGNOSIS — K5732 Diverticulitis of large intestine without perforation or abscess without bleeding: Secondary | ICD-10-CM | POA: Diagnosis not present

## 2017-10-24 DIAGNOSIS — G9349 Other encephalopathy: Secondary | ICD-10-CM

## 2017-10-24 DIAGNOSIS — Z8673 Personal history of transient ischemic attack (TIA), and cerebral infarction without residual deficits: Secondary | ICD-10-CM

## 2017-10-24 DIAGNOSIS — R4182 Altered mental status, unspecified: Secondary | ICD-10-CM | POA: Diagnosis not present

## 2017-10-25 DIAGNOSIS — K219 Gastro-esophageal reflux disease without esophagitis: Secondary | ICD-10-CM | POA: Diagnosis not present

## 2017-10-25 DIAGNOSIS — D72829 Elevated white blood cell count, unspecified: Secondary | ICD-10-CM | POA: Diagnosis not present

## 2017-10-25 DIAGNOSIS — R4182 Altered mental status, unspecified: Secondary | ICD-10-CM | POA: Diagnosis not present

## 2017-10-25 DIAGNOSIS — K5732 Diverticulitis of large intestine without perforation or abscess without bleeding: Secondary | ICD-10-CM | POA: Diagnosis not present

## 2017-11-16 ENCOUNTER — Ambulatory Visit (INDEPENDENT_AMBULATORY_CARE_PROVIDER_SITE_OTHER): Payer: Medicare Other | Admitting: Cardiology

## 2017-11-16 ENCOUNTER — Encounter: Payer: Self-pay | Admitting: Cardiology

## 2017-11-16 VITALS — BP 140/62 | HR 62 | Ht 65.0 in | Wt 154.0 lb

## 2017-11-16 DIAGNOSIS — I6522 Occlusion and stenosis of left carotid artery: Secondary | ICD-10-CM | POA: Diagnosis not present

## 2017-11-16 DIAGNOSIS — I2510 Atherosclerotic heart disease of native coronary artery without angina pectoris: Secondary | ICD-10-CM | POA: Diagnosis not present

## 2017-11-16 DIAGNOSIS — I2583 Coronary atherosclerosis due to lipid rich plaque: Secondary | ICD-10-CM

## 2017-11-16 DIAGNOSIS — E7849 Other hyperlipidemia: Secondary | ICD-10-CM | POA: Diagnosis not present

## 2017-11-16 DIAGNOSIS — I10 Essential (primary) hypertension: Secondary | ICD-10-CM | POA: Diagnosis not present

## 2017-11-16 LAB — COMPREHENSIVE METABOLIC PANEL
ALT: 10 IU/L (ref 0–32)
AST: 9 IU/L (ref 0–40)
Albumin/Globulin Ratio: 2 (ref 1.2–2.2)
Albumin: 4.3 g/dL (ref 3.5–4.7)
Alkaline Phosphatase: 52 IU/L (ref 39–117)
BUN/Creatinine Ratio: 19 (ref 12–28)
BUN: 15 mg/dL (ref 8–27)
Bilirubin Total: 0.8 mg/dL (ref 0.0–1.2)
CO2: 24 mmol/L (ref 20–29)
Calcium: 10.1 mg/dL (ref 8.7–10.3)
Chloride: 94 mmol/L — ABNORMAL LOW (ref 96–106)
Creatinine, Ser: 0.77 mg/dL (ref 0.57–1.00)
GFR calc Af Amer: 80 mL/min/{1.73_m2} (ref >59)
GFR calc non Af Amer: 70 mL/min/{1.73_m2} (ref >59)
Globulin, Total: 2.2 g/dL (ref 1.5–4.5)
Glucose: 220 mg/dL — ABNORMAL HIGH (ref 65–99)
Potassium: 4.8 mmol/L (ref 3.5–5.2)
Sodium: 138 mmol/L (ref 134–144)
Total Protein: 6.5 g/dL (ref 6.0–8.5)

## 2017-11-16 LAB — CBC WITH DIFFERENTIAL/PLATELET
Basophils Absolute: 0 10*3/uL (ref 0.0–0.2)
Basos: 0 %
EOS (ABSOLUTE): 0.1 10*3/uL (ref 0.0–0.4)
Eos: 1 %
Hematocrit: 37.7 % (ref 34.0–46.6)
Hemoglobin: 12.9 g/dL (ref 11.1–15.9)
Immature Grans (Abs): 0 10*3/uL (ref 0.0–0.1)
Immature Granulocytes: 0 %
Lymphocytes Absolute: 3.2 10*3/uL — ABNORMAL HIGH (ref 0.7–3.1)
Lymphs: 32 %
MCH: 30.9 pg (ref 26.6–33.0)
MCHC: 34.2 g/dL (ref 31.5–35.7)
MCV: 90 fL (ref 79–97)
Monocytes Absolute: 0.7 10*3/uL (ref 0.1–0.9)
Monocytes: 7 %
Neutrophils Absolute: 6 10*3/uL (ref 1.4–7.0)
Neutrophils: 60 %
Platelets: 248 10*3/uL (ref 150–450)
RBC: 4.18 x10E6/uL (ref 3.77–5.28)
RDW: 12.9 % (ref 12.3–15.4)
WBC: 10 10*3/uL (ref 3.4–10.8)

## 2017-11-16 LAB — TSH: TSH: 3.55 u[IU]/mL (ref 0.450–4.500)

## 2017-11-16 MED ORDER — LISINOPRIL 10 MG PO TABS: 10.0000 mg | ORAL_TABLET | Freq: Every day | ORAL | 1 refills | Status: DC

## 2017-11-16 MED ORDER — METOPROLOL SUCCINATE ER 50 MG PO TB24: 50.0000 mg | ORAL_TABLET | Freq: Every day | ORAL | 1 refills | Status: DC

## 2017-11-16 NOTE — Progress Notes (Signed)
Cardiology Office Note    Date:  11/16/2017   ID:  Valerie Arnold, DOB Oct 26, 1927, MRN [id number]  PCP:  Anselmo Pickler, MD  Cardiologist: Dr. Delton See  Chief complain: 3 months follow up  History of Present Illness:  Valerie Arnold is a 84 y.o. female with PMH significant for DM (insulin dependent), HTN, CKD stage 2, and HLD. No prior CAD. She was transferred here from [facility] with presentation of ischemic stroke on 02/27/2016 (acute 3-4 mm infarct in the right frontoparietal junction without hemorrhage or mass effect). Afib RVR as a new dg on admission to [facility], cardioverted spontaneously into SR with metoprolol, and NSTEMI Troponin 1.52--> 2.46 (6 in [facility]). No chest pain, CVA, DNR, advanced age so managed medically.  2-D echo showed normal LV function, akinesis of the basal inferior wall, grade 1 DD and elevated LV filling pressures, moderate LVH, mild to moderate MR.CHADSVASC=8.  Carotid Dopplers were performed, revealing high-grade left carotid bifurcation stenosis involving the proximal ICA with near occlusion, as well as mild diffuse disease on the right. Vascular surgery at [facility] was consulted regarding the high-grade carotid stenosis, but it was advised that the patient will need to wait at least 2-3 weeks for consideration of surgery given the acute MI and acute stroke.   03/19/2017 - 3 months follow up, she was hospitalized in January 2019 with UTI/dehydrtion/presyncope where her husband prevented fall. She has improved since then including her mental status. Her BPs have been all over - with several in 90', most in 120-160 range and few in 200. Most elevated BP in the evening. No chest pain or SOB, walks with a walker.  07/09/2017 - the patient comes accompanied by a daughter, the patient is somnolent and falling asleep in the room. The daughter states that she has been suffering from UTIs, her mental status fluctuates with it, on and off ATB for the last  few months. No LE edema, orthopnea or PND, however BO fluctuates significantly when having UTI, 90' in the am and 150-180 in the evening, no falls.   11/16/2017, this is a 3 months follow-up, since the last visit the patient has been admitted with acute diverticulitis and shortly after with an UTI.  She has been on antibiotics for prolonged time, her mental status has deteriorated in follow-up CT of her head was performed that showed new ischemic stroke.  Her blood pressure has been significantly elevated and as a result her metoprolol has been increased to 50 daily, and lisinopril increased to 10 mg daily.  Her daughter brings blood pressure diary with blood pressure ranging from 10 7-1 70.  She states that patient has been more confused and moaning more than ever before.  She continues to walk and exercise with physical therapy that is visiting them at home.  Past Medical History:  Diagnosis Date  . Anxiety   . Depression   . Diabetes (HCC)   . Headache 06/09/2015  . High cholesterol   . Hypertension   . Macular degeneration   . Migraine   . Syncope and collapse 06/09/2015    Past Surgical History:  Procedure Laterality Date  . ABDOMINAL HYSTERECTOMY    . APPENDECTOMY    . CAROTID ARTERY ANGIOPLASTY Right   . CATARACT EXTRACTION Bilateral   . LUMBAR LAMINECTOMY      Current Medications: Outpatient Medications Prior to Visit  Medication Sig Dispense Refill  . apixaban (ELIQUIS) 5 MG TABS tablet Take 1 tablet (5 mg total)  by mouth 2 (two) times daily. 180 tablet 2  . beta carotene w/minerals (OCUVITE) tablet Take 1 tablet by mouth daily.    . Cholecalciferol (VITAMIN D-3 PO) Take 5,000 Int'l Units by mouth daily.    . Ferrous Sulfate (SLOW RELEASE IRON PO) Take 1 tablet every other day by mouth.    . levothyroxine (SYNTHROID, LEVOTHROID) 50 MCG tablet TAKE 1 TABLET BY MOUTH DAILY ON EMPTY STOMACH WAIT 1 HOUR BEFORE AKING ANY OTHER MEDS/FOOD/DRINK  5  . lisinopril (PRINIVIL,ZESTRIL) 10  MG tablet Take 10 mg by mouth at bedtime.  0  . Melatonin-Pyridoxine (MELATIN PO) Take 1 tablet by mouth at bedtime.    . metFORMIN (GLUCOPHAGE-XR) 500 MG 24 hr tablet Take 500 mg by mouth 2 (two) times daily with a meal.  3  . metoprolol succinate (TOPROL-XL) 50 MG 24 hr tablet Take 50 mg by mouth daily.  0  . nitrofurantoin (MACRODANTIN) 50 MG capsule Take 50 mg by mouth daily.     . pantoprazole (PROTONIX) 40 MG tablet Take 1 tablet (40 mg total) by mouth daily.    . Phenazopyridine HCl (AZO-STANDARD PO) Take 1 capsule 2 (two) times daily by mouth.    . pravastatin (PRAVACHOL) 20 MG tablet TAKE ONE TABLET BY MOUTH EVERY EVENING 90 tablet 3  . traZODone (DESYREL) 50 MG tablet Take 12.5 mg by mouth at bedtime.   0  . TRESIBA FLEXTOUCH 100 UNIT/ML SOPN FlexTouch Pen Inject 12 units once a day with additional as needed  5  . lisinopril (PRINIVIL,ZESTRIL) 5 MG tablet Take 1 tablet (5 mg total) by mouth at bedtime. 90 tablet 2  . metoprolol succinate (TOPROL-XL) 25 MG 24 hr tablet TAKE 1/2 TABLET BY MOUTH EVERY DAY 45 tablet 3  . Insulin Glargine (LANTUS SOLOSTAR) 100 UNIT/ML Solostar Pen Inject 12 Units into the skin daily.     No facility-administered medications prior to visit.      Allergies:   Patient has no known allergies.   Social History   Socioeconomic History  . Marital status: Married    Spouse name: Fayrene Fearing  . Number of children: 6  . Years of education: 49  . Highest education level: Not on file  Occupational History  . Occupation: Retired  Engineer, production  . Financial resource strain: Not on file  . Food insecurity:    Worry: Not on file    Inability: Not on file  . Transportation needs:    Medical: Not on file    Non-medical: Not on file  Tobacco Use  . Smoking status: Never Smoker  . Smokeless tobacco: Never Used  Substance and Sexual Activity  . Alcohol use: No    Alcohol/week: 0.0 standard drinks  . Drug use: No  . Sexual activity: Not on file  Lifestyle  .  Physical activity:    Days per week: Not on file    Minutes per session: Not on file  . Stress: Not on file  Relationships  . Social connections:    Talks on phone: Not on file    Gets together: Not on file    Attends religious service: Not on file    Active member of club or organization: Not on file    Attends meetings of clubs or organizations: Not on file    Relationship status: Not on file  Other Topics Concern  . Not on file  Social History Narrative   Lives at home w/ her husband   Right-handed  Drinks about 2 cups of coffee per day     Family History:  The patient's family history includes Dementia in her brother; Emphysema in her brother; Heart disease in her father; Prostate cancer in her father.   ROS:   Please see the history of present illness.    Review of Systems  Constitution: Positive for malaise/fatigue.  HENT: Negative.   Eyes: Negative.   Cardiovascular: Negative.   Respiratory: Negative.   Hematologic/Lymphatic: Negative.   Musculoskeletal: Positive for falls, muscle weakness, myalgias and stiffness. Negative for joint pain.  Gastrointestinal: Negative.   Genitourinary: Negative.   Neurological: Positive for headaches and weakness.   All other systems reviewed and are negative.  PHYSICAL EXAM:   VS:  BP 140/62   Pulse 62   Ht 5\' 5"  (1.651 m)   Wt 154 lb (69.9 kg)   BMI 25.63 kg/m   Physical Exam  GEN: Well nourished, well developed, in no acute distress , somnolent Neck: no JVD, bilateral carotid bruits Cardiac:RRR; 2/6 systolic murmurs, rubs, or gallops  Respiratory:  Increased breath sounds at the left lung base otherwise clear GI: soft, nontender, nondistended, + BS Ext: without cyanosis, clubbing, or edema, Good distal pulses bilaterally Psych: euthymic mood, full affect  Wt Readings from Last 3 Encounters:  11/16/17 154 lb (69.9 kg)  07/11/17 142 lb 6.4 oz (64.6 kg)  03/19/17 140 lb 12.8 oz (63.9 kg)      Studies/Labs Reviewed:    EKG:  EKG is ordered today.  The ekg ordered today demonstrates Normal sinus rhythm with first-degree AV block inferior Q waves ST-T wave changes laterally  Recent Labs: 07/11/2017: ALT 11; BUN 23; Creatinine, Ser 0.79; Hemoglobin 12.6; Platelets 297; Potassium 5.1; Sodium 135; TSH 2.860   Lipid Panel    Component Value Date/Time   CHOL 161 03/02/2016 0332   TRIG 172 (H) 03/02/2016 0332   HDL 27 (L) 03/02/2016 0332   CHOLHDL 6.0 03/02/2016 0332   VLDL 34 03/02/2016 0332   LDLCALC 100 (H) 03/02/2016 0332   EKG performed today 12/18/2016 was personally reviewed and shows sinus rhythm with first-degree A-V LOC, incomplete right bundle branch block, anterior MI age undetermined and negative T waves in the lateral leads, this is unchanged from the prior EKG.  Additional studies/ records that were reviewed today include:    Echo 03/02/16 LV EF: 55% -   60%  - Left ventricle: The cavity size was normal. Wall thickness was   increased in a pattern of moderate LVH. Systolic function was   normal. The estimated ejection fraction was in the range of 55%   to 60%. There is akinesis of the basalinferior myocardium.   Doppler parameters are consistent with abnormal left ventricular   relaxation (grade 1 diastolic dysfunction). Doppler parameters   are consistent with high ventricular filling pressure. - Mitral valve: There was moderate regurgitation. - Pulmonary arteries: PA peak pressure: 34 mm Hg (S). - Pericardium, extracardiac: A trivial pericardial effusion was   identified.   Impressions:   - Akinesis of the basal inferior wall with overall normal LV   systolic function; grade 1 diastolic dysfunction with elevated LV   filling pressure; moderate LVH; calcified aortic valve with no AS   by doppler; moderate, posterior directed MR; mild TR.  Vascular Ultrasound Carotid Duplex (Doppler) has been completed.   Findings suggest upper range 40-59% versus low range 60-79% right internal  carotid artery stenosis and 80-99% left internal carotid artery stenosis. Vertebral arteries are  patent with antegrade flow.   ASSESSMENT:    1. Essential hypertension   2. Coronary artery disease due to lipid rich plaque   3. Other hyperlipidemia    PLAN:  In order of problems listed above:  1. CAD with NSTEMI in 03/2016, peak troponins 2.46 treated medically because of the setting of acute CVA, DO NOT RESUSCITATE, age, A. fib. No ischemic workup given poor mental status and poor prognosis overall.  She also has no symptoms of chest pain or shortness of breath.    2. Ischemic stroke 02/27/16, and recurrent stroke in September 2019, CHADSVASC=8. Eliquis. Carotid disease RICA 99% LICA CA and 60-79% R ICA. Vascular surgery prefers medical management considering recent large stroke.   3. A. fib with RVR CHADSVASC=8 patient normal sinus rhythm today. No bleeding with Eliquis, no falls.  4. HTN -Toprol 50 mg p.o. daily and lisinopril 10 mg p.o. daily, her blood pressure still elevated, however shortly after stroke, I am concerned that if I increase her lisinopril we are risking orthostasis and falls in the settings of acute mental status changes in patient being on Eliquis.  We will follow-up in 4 weeks in our blood pressure clinic and if blood pressure still elevated will increase lisinopril at that time.  5. Hyperlipidemia - continue pravastatin.  Obtain CMP CBC today, follow-up with blood pressure clinic in 4 weeks and with me in 3 months.  Medication Adjustments/Labs and Tests Ordered: Current medicines are reviewed at length with the patient today.  Concerns regarding medicines are outlined above.  Medication changes, Labs and Tests ordered today are listed in the Patient Instructions below. Patient Instructions  Medication Instructions:   Your physician recommends that you continue on your current medications as directed. Please refer to the Current Medication list given to you  today.  If you need a refill on your cardiac medications before your next appointment, please call your pharmacy.    Lab work:  TODAY--CMET, CBC W DIFF, AND TSH  If you have labs (blood work) drawn today and your tests are completely normal, you will receive your results only by: Marland Kitchen MyChart Message (if you have MyChart) OR . A paper copy in the mail If you have any lab test that is abnormal or we need to change your treatment, we will call you to review the results.    Follow-Up:  IN 4 WEEKS IN BP CLINIC  4 MONTHS WITH DR Alan Ripper, Tobias Alexander, MD  11/16/2017 10:48 AM    [facility] [address] [place], [place], [place]  [address] Phone: [phone]; Fax: [phone]

## 2017-11-16 NOTE — Patient Instructions (Signed)
Medication Instructions:   Your physician recommends that you continue on your current medications as directed. Please refer to the Current Medication list given to you today.  If you need a refill on your cardiac medications before your next appointment, please call your pharmacy.    Lab work:  TODAY--CMET, CBC W DIFF, AND TSH  If you have labs (blood work) drawn today and your tests are completely normal, you will receive your results only by: Marland Kitchen MyChart Message (if you have MyChart) OR . A paper copy in the mail If you have any lab test that is abnormal or we need to change your treatment, we will call you to review the results.    Follow-Up:  IN 4 WEEKS IN BP CLINIC  4 MONTHS WITH DR Delton See

## 2017-12-13 ENCOUNTER — Ambulatory Visit: Payer: Medicare Other

## 2017-12-14 ENCOUNTER — Ambulatory Visit (INDEPENDENT_AMBULATORY_CARE_PROVIDER_SITE_OTHER): Payer: Medicare Other | Admitting: Pharmacist

## 2017-12-14 VITALS — BP 140/62 | HR 71

## 2017-12-14 DIAGNOSIS — I6522 Occlusion and stenosis of left carotid artery: Secondary | ICD-10-CM | POA: Diagnosis not present

## 2017-12-14 DIAGNOSIS — I10 Essential (primary) hypertension: Secondary | ICD-10-CM | POA: Diagnosis not present

## 2017-12-14 MED ORDER — LISINOPRIL 20 MG PO TABS: 20.0000 mg | ORAL_TABLET | Freq: Every day | ORAL | 3 refills | Status: DC

## 2017-12-14 NOTE — Patient Instructions (Addendum)
Thank you for coming to see us today!  CONTINUE taking metoprolol succinate 50 mg daily.   START taking lisinopril 20 mg daily. We have sent a new prescription to your pharmacy. You can take 2 tablets of the 10 mg dose a day until you are out of what you have at home.   CONTINUE checking blood pressure at home. If blood pressure drops too much, please give us a call #[phone]. Our blood pressure goal is <130/80.  Follow up with us in 4 weeks for a repeat blood pressure check and labs.

## 2017-12-14 NOTE — Progress Notes (Signed)
Patient ID: Valerie Arnold                 DOB: [birth year]                      MRN: [id number]     HPI: Valerie Arnold is a 84 y.o. female referred by Dr. Delton See to HTN clinic. PMH is significant for DM (insulin dependent), HTN, CKD stage 2, CAD, HLD, ischemic stroke and NSTEMI on 02/27/2016. At last visit, patient had elevated blood pressure (140/62) but no medication changes were made due to risk of orthostasis and falls with recent stroke and acute mental status changes.   Patient presents today with her daughter. Patient is unable to speak for herself and her daughter provided all information. She reports that a home health nurse is at home around the clock who checks patient's blood pressure. Per daughter, Dr. Delton See is ok with some blood pressure elevation up to 170 systolic due to advanced age.   Daughter reports that patient is not complaining of dizziness of headaches. Prior to most recent hospitalization/ischemic stroke, patient had been complaining of headaches, but she has not been complaining about headaches since. Patient is unsteady on her feet and walks with a walker. Occasional dizziness that has resolved with lying down.  Patient has had her blood pressure cuff for ~1 year and most blood pressure readings are taken while she is lying down. Daughter reports that at times there are elevations in patient's blood pressure to >190s, but after sitting for a few minutes the pressure improves 20-7mmHg. Many BP readings 160-190s at home. Patient is no longer taking potassium supplements (stopped ~1 week ago). She has started taking D-mannose for UTI prevention as recommended by PCP.   Daughter reports that patient is in the donut hole, which makes affordability of Eliquis difficult. She asks if there are enough samples available to last through the end of the year.   Current HTN meds:  Lisinopril 10 mg daily - PM Metoprolol succinate 50 mg daily - AM  Previously tried:  Metoprolol tartrate  (hypotension)  BP goal: <130/78mmHg  Diet: Eats mostly from home. She eats "everything." Patient has history of diverticulitis so have to be careful with food with seeds. Eats beans, vegetables, bread, oatmeal, eggs, bacon (low sodium). Chicken (shredded), fish. No caffeine; may have regular soda if her blood sugar gets low, decaf tea, mostly water.   Exercise: Stands up and walks around every hour  Home BP readings: average 160s/80s, high readings 180-200s/80s, rare low of 100s/50s  Wt Readings from Last 3 Encounters:  11/16/17 154 lb (69.9 kg)  07/11/17 142 lb 6.4 oz (64.6 kg)  03/19/17 140 lb 12.8 oz (63.9 kg)   BP Readings from Last 3 Encounters:  11/16/17 140/62  07/11/17 134/70  03/19/17 (!) 100/58   Pulse Readings from Last 3 Encounters:  11/16/17 62  07/11/17 70  03/19/17 81    Renal function: CrCl cannot be calculated (Patient's most recent lab result is older than the maximum 21 days allowed.).  Past Medical History:  Diagnosis Date  . Anxiety   . Depression   . Diabetes (HCC)   . Headache 06/09/2015  . High cholesterol   . Hypertension   . Macular degeneration   . Migraine   . Syncope and collapse 06/09/2015    Current Outpatient Medications on File Prior to Visit  Medication Sig Dispense Refill  . apixaban (ELIQUIS) 5 MG TABS tablet Take 1 tablet (  5 mg total) by mouth 2 (two) times daily. 180 tablet 2  . beta carotene w/minerals (OCUVITE) tablet Take 1 tablet by mouth daily.    . Cholecalciferol (VITAMIN D-3 PO) Take 5,000 Int'l Units by mouth daily.    . Ferrous Sulfate (SLOW RELEASE IRON PO) Take 1 tablet every other day by mouth.    . levothyroxine (SYNTHROID, LEVOTHROID) 50 MCG tablet TAKE 1 TABLET BY MOUTH DAILY ON EMPTY STOMACH WAIT 1 HOUR BEFORE AKING ANY OTHER MEDS/FOOD/DRINK  5  . lisinopril (PRINIVIL,ZESTRIL) 10 MG tablet Take 1 tablet (10 mg total) by mouth at bedtime. 90 tablet 1  . Melatonin-Pyridoxine (MELATIN PO) Take 1 tablet by mouth at  bedtime.    . metFORMIN (GLUCOPHAGE-XR) 500 MG 24 hr tablet Take 500 mg by mouth 2 (two) times daily with a meal.  3  . metoprolol succinate (TOPROL-XL) 50 MG 24 hr tablet Take 1 tablet (50 mg total) by mouth daily. 90 tablet 1  . nitrofurantoin (MACRODANTIN) 50 MG capsule Take 50 mg by mouth daily.     . pantoprazole (PROTONIX) 40 MG tablet Take 1 tablet (40 mg total) by mouth daily.    . Phenazopyridine HCl (AZO-STANDARD PO) Take 1 capsule 2 (two) times daily by mouth.    . pravastatin (PRAVACHOL) 20 MG tablet TAKE ONE TABLET BY MOUTH EVERY EVENING 90 tablet 3  . traZODone (DESYREL) 50 MG tablet Take 12.5 mg by mouth at bedtime.   0  . TRESIBA FLEXTOUCH 100 UNIT/ML SOPN FlexTouch Pen Inject 12 units once a day with additional as needed  5   No current facility-administered medications on file prior to visit.     No Known Allergies   Assessment/Plan:  1. Hypertension - Blood pressure in office and per home log is above goal <130/5080mmHg due to recent stroke. Ok with some readings above this 140-150s due to history of altered mental status, limited mobility and advanced age. Continue metoprolol succinate 50mg  daily. Increase lisinopril to 20mg  daily. Counseled daughter to call office if blood pressure decreases to much below 130/80 as one of our main concerns is avoiding dizziness and falls. Discussed repeat blood pressure measurement if one reading is elevated beyond normal to assess if patient is truly hypertensive or if elevated blood pressure is an outlier. Provided patient with samples of Eliquis to last through the end of the year. Follow up with hypertension clinic in 1 month for repeat blood pressure check and BMET.    Patient seen with Lyanne Cooopali Sharma, PharmD Student   Megan E. Supple, PharmD, BCACP, CPP [facility] [address], [place], [place] [address] Phone: [phone]; Fax: [phone]) [phone] 12/14/2017 12:18 PM

## 2018-01-09 ENCOUNTER — Ambulatory Visit: Payer: Medicare Other

## 2018-03-12 ENCOUNTER — Ambulatory Visit (INDEPENDENT_AMBULATORY_CARE_PROVIDER_SITE_OTHER): Payer: Medicare Other | Admitting: Cardiology

## 2018-03-12 ENCOUNTER — Encounter: Payer: Self-pay | Admitting: Cardiology

## 2018-03-12 VITALS — BP 132/70 | HR 62 | Ht 66.0 in | Wt 145.0 lb

## 2018-03-12 DIAGNOSIS — I2583 Coronary atherosclerosis due to lipid rich plaque: Secondary | ICD-10-CM

## 2018-03-12 DIAGNOSIS — I2510 Atherosclerotic heart disease of native coronary artery without angina pectoris: Secondary | ICD-10-CM

## 2018-03-12 DIAGNOSIS — I214 Non-ST elevation (NSTEMI) myocardial infarction: Secondary | ICD-10-CM

## 2018-03-12 DIAGNOSIS — I10 Essential (primary) hypertension: Secondary | ICD-10-CM

## 2018-03-12 DIAGNOSIS — I480 Paroxysmal atrial fibrillation: Secondary | ICD-10-CM

## 2018-03-12 DIAGNOSIS — E785 Hyperlipidemia, unspecified: Secondary | ICD-10-CM

## 2018-03-12 DIAGNOSIS — I639 Cerebral infarction, unspecified: Secondary | ICD-10-CM

## 2018-03-12 MED ORDER — METOPROLOL SUCCINATE ER 50 MG PO TB24: 50.0000 mg | ORAL_TABLET | Freq: Every day | ORAL | 3 refills | Status: AC

## 2018-03-12 MED ORDER — LISINOPRIL 20 MG PO TABS: 20.0000 mg | ORAL_TABLET | Freq: Every day | ORAL | 3 refills | Status: AC

## 2018-03-12 MED ORDER — APIXABAN 5 MG PO TABS: 5.0000 mg | ORAL_TABLET | Freq: Two times a day (BID) | ORAL | 2 refills | Status: AC

## 2018-03-12 MED ORDER — PRAVASTATIN SODIUM 20 MG PO TABS: 20.0000 mg | ORAL_TABLET | Freq: Every evening | ORAL | 3 refills | Status: AC

## 2018-03-12 NOTE — Patient Instructions (Signed)
Medication Instructions:   Your physician recommends that you continue on your current medications as directed. Please refer to the Current Medication list given to you today.  If you need a refill on your cardiac medications before your next appointment, please call your pharmacy.       Follow-Up:  ADD TO DR NELSON'S CLINIC ON 07/05/2018 AT HER END SLOT PER DR Delton See

## 2018-03-12 NOTE — Progress Notes (Signed)
Cardiology Office Note    Date:  03/12/2018   ID:  Ayaka Andes, DOB [birth year], MRN [id number]  PCP:  Anselmo Pickler, MD  Cardiologist: Dr. Delton See  Chief complain: 3 months follow up  History of Present Illness:  Valerie Arnold is a 85 y.o. female with PMH significant for DM (insulin dependent), HTN, CKD stage 2, and HLD. No prior CAD. She was transferred here from [facility] with presentation of ischemic stroke on 02/27/2016 (acute 3-4 mm infarct in the right frontoparietal junction without hemorrhage or mass effect). Afib RVR as a new dg on admission to [facility], cardioverted spontaneously into SR with metoprolol, and NSTEMI Troponin 1.52--> 2.46 (6 in [facility]). No chest pain, CVA, DNR, advanced age so managed medically.  2-D echo showed normal LV function, akinesis of the basal inferior wall, grade 1 DD and elevated LV filling pressures, moderate LVH, mild to moderate MR.CHADSVASC=8.  Carotid Dopplers were performed, revealing high-grade left carotid bifurcation stenosis involving the proximal ICA with near occlusion, as well as mild diffuse disease on the right. Vascular surgery at [facility] was consulted, Dr Randie Heinz stated that since she is quite weak with advanced dementia and under 24-hour care at home, they would not consider any intervention for this high-grade stenosis on the left.  She was again hospitalized in January 2019 with UTI, dehydration, presyncope.  Since then she has had several episodes UTI that affect her mentation significantly.  She is otherwise stable has no significant lower extremity edema no orthopnea or proximal nocturnal dyspnea.  She has not had any falls.  She was followed by our hypertension clinic and her lisinopril was increased to 20 mg daily.  Her blood pressure is controlled today but in her diary some of the blood pressures are in 170s.  She denies any falls.  No bleeding on Eliquis.   Past Medical History:  Diagnosis Date  . Anxiety     . Depression   . Diabetes (HCC)   . Headache 06/09/2015  . High cholesterol   . Hypertension   . Macular degeneration   . Migraine   . Syncope and collapse 06/09/2015    Past Surgical History:  Procedure Laterality Date  . ABDOMINAL HYSTERECTOMY    . APPENDECTOMY    . CAROTID ARTERY ANGIOPLASTY Right   . CATARACT EXTRACTION Bilateral   . LUMBAR LAMINECTOMY      Current Medications: Outpatient Medications Prior to Visit  Medication Sig Dispense Refill  . beta carotene w/minerals (OCUVITE) tablet Take 1 tablet by mouth daily.    . Cholecalciferol (VITAMIN D-3 PO) Take 5,000 Int'l Units by mouth daily.    . D-Mannose POWD Take 1,000 mg by mouth daily.    . Ferrous Sulfate (SLOW RELEASE IRON PO) Take 1 tablet every other day by mouth.    . Lactobacillus (PROBIOTIC ACIDOPHILUS PO) Take 2 tablets by mouth daily.    Marland Kitchen levothyroxine (SYNTHROID, LEVOTHROID) 50 MCG tablet TAKE 1 TABLET BY MOUTH DAILY ON EMPTY STOMACH WAIT 1 HOUR BEFORE AKING ANY OTHER MEDS/FOOD/DRINK  5  . Melatonin-Pyridoxine (MELATIN PO) Take 1 tablet by mouth at bedtime.    . metFORMIN (GLUCOPHAGE-XR) 500 MG 24 hr tablet Take 500 mg by mouth 2 (two) times daily with a meal. 1 tab for breakfast, 2 tabs for dinner  3  . nitrofurantoin (MACRODANTIN) 50 MG capsule Take 50 mg by mouth daily.     . pantoprazole (PROTONIX) 40 MG tablet Take 1 tablet (40 mg total) by  mouth daily.    . traZODone (DESYREL) 50 MG tablet Take 12.5 mg by mouth at bedtime.   0  . TRESIBA FLEXTOUCH 100 UNIT/ML SOPN FlexTouch Pen Inject 12 units once a day with additional as needed  5  . apixaban (ELIQUIS) 5 MG TABS tablet Take 1 tablet (5 mg total) by mouth 2 (two) times daily. 180 tablet 2  . lisinopril (PRINIVIL,ZESTRIL) 20 MG tablet Take 1 tablet (20 mg total) by mouth at bedtime. 90 tablet 3  . metoprolol succinate (TOPROL-XL) 50 MG 24 hr tablet Take 1 tablet (50 mg total) by mouth daily. 90 tablet 1  . pravastatin (PRAVACHOL) 20 MG tablet TAKE  ONE TABLET BY MOUTH EVERY EVENING 90 tablet 3   No facility-administered medications prior to visit.      Allergies:   Patient has no known allergies.   Social History   Socioeconomic History  . Marital status: Married    Spouse name: Fayrene FearingJames  . Number of children: 6  . Years of education: 849  . Highest education level: Not on file  Occupational History  . Occupation: Retired  Engineer, productionocial Needs  . Financial resource strain: Not on file  . Food insecurity:    Worry: Not on file    Inability: Not on file  . Transportation needs:    Medical: Not on file    Non-medical: Not on file  Tobacco Use  . Smoking status: Never Smoker  . Smokeless tobacco: Never Used  Substance and Sexual Activity  . Alcohol use: No    Alcohol/week: 0.0 standard drinks  . Drug use: No  . Sexual activity: Not on file  Lifestyle  . Physical activity:    Days per week: Not on file    Minutes per session: Not on file  . Stress: Not on file  Relationships  . Social connections:    Talks on phone: Not on file    Gets together: Not on file    Attends religious service: Not on file    Active member of club or organization: Not on file    Attends meetings of clubs or organizations: Not on file    Relationship status: Not on file  Other Topics Concern  . Not on file  Social History Narrative   Lives at home w/ her husband   Right-handed   Drinks about 2 cups of coffee per day     Family History:  The patient's family history includes Dementia in her brother; Emphysema in her brother; Heart disease in her father; Prostate cancer in her father.   ROS:   Please see the history of present illness.    Review of Systems  Constitution: Positive for malaise/fatigue.  HENT: Negative.   Eyes: Negative.   Cardiovascular: Negative.   Respiratory: Negative.   Hematologic/Lymphatic: Negative.   Musculoskeletal: Positive for falls, muscle weakness, myalgias and stiffness. Negative for joint pain.   Gastrointestinal: Negative.   Genitourinary: Negative.   Neurological: Positive for headaches and weakness.   All other systems reviewed and are negative.  PHYSICAL EXAM:   VS:  BP 132/70   Pulse 62   Ht 5\' 6"  (1.676 m)   Wt 145 lb (65.8 kg)   SpO2 98%   BMI 23.40 kg/m   Physical Exam  GEN: Well nourished, well developed, in no acute distress , somnolent Neck: no JVD, bilateral carotid bruits Cardiac:RRR; 2/6 systolic murmurs, rubs, or gallops  Respiratory:  Increased breath sounds at the left lung base otherwise  clear GI: soft, nontender, nondistended, + BS Ext: without cyanosis, clubbing, or edema, Good distal pulses bilaterally Psych: euthymic mood, full affect  Wt Readings from Last 3 Encounters:  03/12/18 145 lb (65.8 kg)  11/16/17 154 lb (69.9 kg)  07/11/17 142 lb 6.4 oz (64.6 kg)      Studies/Labs Reviewed:   EKG:  EKG is ordered today.  The ekg ordered today demonstrates Normal sinus rhythm with first-degree AV block inferior Q waves ST-T wave changes laterally  Recent Labs: 11/16/2017: ALT 10; BUN 15; Creatinine, Ser 0.77; Hemoglobin 12.9; Platelets 248; Potassium 4.8; Sodium 138; TSH 3.550   Lipid Panel    Component Value Date/Time   CHOL 161 03/02/2016 0332   TRIG 172 (H) 03/02/2016 0332   HDL 27 (L) 03/02/2016 0332   CHOLHDL 6.0 03/02/2016 0332   VLDL 34 03/02/2016 0332   LDLCALC 100 (H) 03/02/2016 0332   EKG shows sinus rhythm with first-degree AV block incomplete right bundle branch block, prior inferior MI age undetermined, this is unchanged from prior.  Personally reviewed.  Additional studies/ records that were reviewed today include:    Echo 03/02/16 LV EF: 55% -   60%  - Left ventricle: The cavity size was normal. Wall thickness was   increased in a pattern of moderate LVH. Systolic function was   normal. The estimated ejection fraction was in the range of 55%   to 60%. There is akinesis of the basalinferior myocardium.   Doppler parameters  are consistent with abnormal left ventricular   relaxation (grade 1 diastolic dysfunction). Doppler parameters   are consistent with high ventricular filling pressure. - Mitral valve: There was moderate regurgitation. - Pulmonary arteries: PA peak pressure: 34 mm Hg (S). - Pericardium, extracardiac: A trivial pericardial effusion was   identified.   Impressions:   - Akinesis of the basal inferior wall with overall normal LV   systolic function; grade 1 diastolic dysfunction with elevated LV   filling pressure; moderate LVH; calcified aortic valve with no AS   by doppler; moderate, posterior directed MR; mild TR.  Vascular Ultrasound Carotid Duplex (Doppler) has been completed.   Findings suggest upper range 40-59% versus low range 60-79% right internal carotid artery stenosis and 80-99% left internal carotid artery stenosis. Vertebral arteries are patent with antegrade flow.   ASSESSMENT:    1. Coronary artery disease due to lipid rich plaque   2. Essential hypertension   3. PAF (paroxysmal atrial fibrillation) (HCC)   4. Hyperlipidemia, unspecified hyperlipidemia type   5. NSTEMI (non-ST elevated myocardial infarction) (HCC)   6. Acute CVA (cerebrovascular accident) (HCC)    PLAN:  In order of problems listed above:  1. CAD with NSTEMI in 03/2016, peak troponins 2.46 treated medically because of the setting of acute CVA, DO NOT RESUSCITATE, age.  The patient has no recurrent symptoms and her EKG is unchanged from prior.   2. Ischemic stroke 02/27/16, and recurrent stroke in September 2019, CHADSVASC=8. Eliquis. Carotid disease RICA 99% LICA CA and 60-79% R ICA. Vascular surgery prefers medical management considering recent large stroke.   3. A. fib with RVR CHADSVASC=8 patient normal sinus rhythm today. No bleeding with Eliquis, no falls.  4. HTN -controlled after lisinopril increased to 20 mg daily.  Some of her blood pressures are still elevated however given her age and being  on Eliquis I prefer not to increase so she does not develop orthostatic hypotension.  5. Hyperlipidemia - continue pravastatin.  Medication Adjustments/Labs and Tests  Ordered: Current medicines are reviewed at length with the patient today.  Concerns regarding medicines are outlined above.  Medication changes, Labs and Tests ordered today are listed in the Patient Instructions below. Patient Instructions  Medication Instructions:   Your physician recommends that you continue on your current medications as directed. Please refer to the Current Medication list given to you today.  If you need a refill on your cardiac medications before your next appointment, please call your pharmacy.       Follow-Up:  ADD TO DR Osha Rane'S CLINIC ON 07/05/2018 AT HER END SLOT PER DR Alan Ripper, Tobias Alexander, MD  03/12/2018 10:42 AM    [facility] [address] [place], [place], [place]  [address] Phone: [phone]; Fax: [phone]

## 2018-06-27 ENCOUNTER — Telehealth: Payer: Self-pay | Admitting: *Deleted

## 2018-06-27 NOTE — Telephone Encounter (Signed)
-----   Message from Leonor Liv sent at 06/27/2018  8:44 AM EDT ----- Good morning,  Patient has been reschedule to 9/24 voicemail message left.  ----- Message ----- From: Valrie Hart, CMA Sent: 06/25/2018  11:17 AM EDT To: Lars Masson, MD, Loa Socks, LPN, #  Spoke with patients daugher, Lurena Joiner and she requested to reschedule until her mother can come in for a live visit. The cell phone reception is not very good there. She says that her mother does not have any cardiac complaints at this time but will reach out to the office if any concerns arise. Please call daughter, Myriam Jacobson at [phone] when ready to reschedule.

## 2018-07-05 ENCOUNTER — Telehealth: Payer: Medicare Other | Admitting: Cardiology

## 2018-08-14 IMAGING — CT CT ANGIO HEAD
1 of 14 series · 5 of 33 positions shown · IV contrast (isovue)
Comparison: None.

CLINICAL DATA: Initial evaluation for carotid artery stenosis.

EXAM:
CT ANGIOGRAPHY HEAD AND NECK
TECHNIQUE: Multidetector CT imaging of the head and neck was performed using
the standard protocol during bolus administration of intravenous
contrast. Multiplanar CT image reconstructions and MIPs were
obtained to evaluate the vascular anatomy. Carotid stenosis
measurements (when applicable) are obtained utilizing NASCET
criteria, using the distal internal carotid diameter as the
denominator.
CONTRAST:  50 cc of Isovue 370.

[Series 11: cta neck axial · axial · 0.37mm/px · z∈[-232,-17]mm · 5 of 323 slices shown]
[im 54/323  soft-tissue]
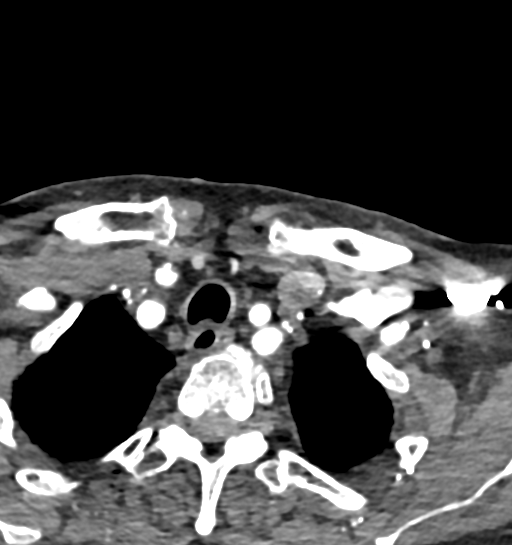
[im 108/323  bone]
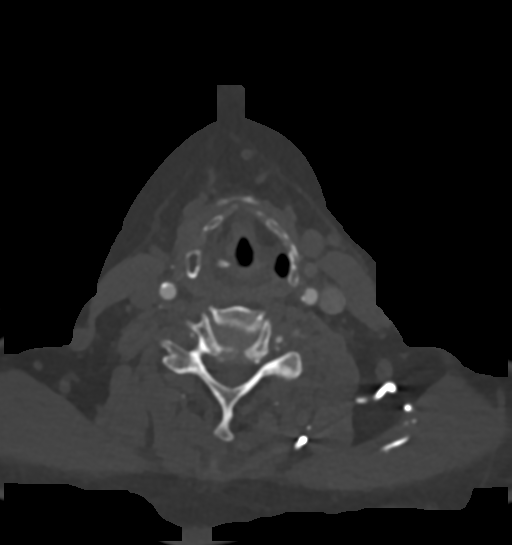
[im 162/323  soft-tissue]
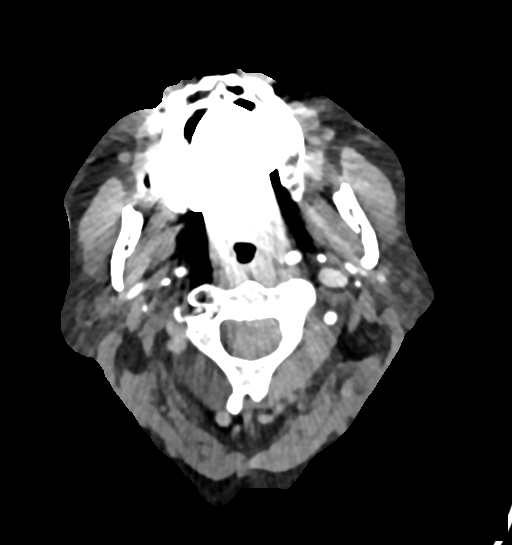
[im 215/323  bone]
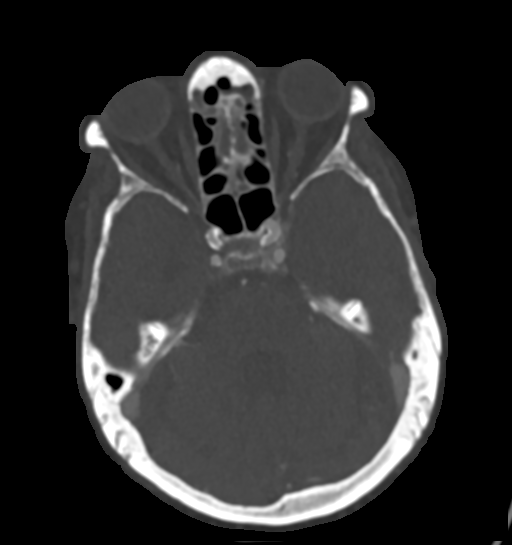
[im 269/323  soft-tissue]
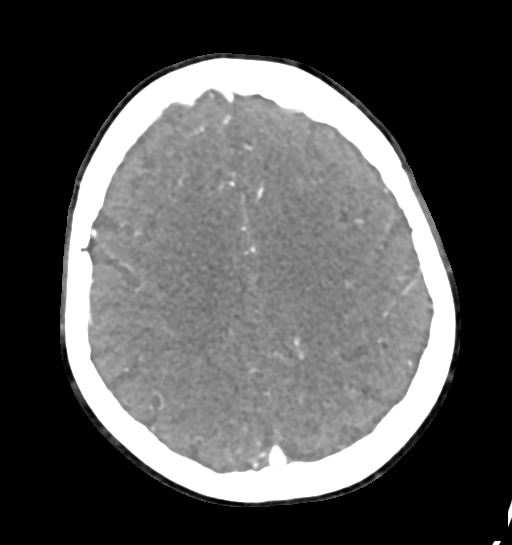

[5 of 33 positions shown; findings below may reference images not displayed]

FINDINGS: CT HEAD FINDINGS

Brain: Atrophy with moderate chronic microvascular ischemic disease.
Remote lacunar infarct present within the left caudate head. No
acute intracranial hemorrhage. No evidence for acute large vessel
territory infarct. Previous identified small infarct within the
right frontal parietal region not seen. No mass lesion, midline
shift or mass effect. Ventricular prominence related to global
parenchymal volume loss without hydrocephalus. No extra-axial fluid
collection.

Vascular: No hyperdense vessel. Scattered vascular calcifications
noted within the carotid siphons.

Skull: Scalp soft tissues within normal limits.  Calvarium intact.

Sinuses: Scattered mucosal thickening within the ethmoidal air
cells, sphenoid sinuses, and maxillary sinuses. Trace right mastoid
effusion.

Orbits: Globes and orbital soft tissues within normal limits.
Patient is status post lens extraction bilaterally.

CTA NECK FINDINGS

Aortic arch: Extensive atheromatous irregularity throughout the
aortic arch. Arch itself of normal caliber with normal 3 vessel
morphology. Atheromatous plaque about the origin of the great
vessels. There is moderate narrowing of approximately 50% at the
proximal right brachiocephalic artery (series 11, image 3 weight).
No other significant stenosis at the origin of the great vessels.
Extensive plaque throughout the visualized subclavian arteries
without significant stenosis.

Right carotid system: Scattered eccentric calcified plaque
throughout the right common carotid artery without flow-limiting
stenosis. Multifocal atheromatous stenoses at the proximal right
subclavian artery measuring up to 50% by NASCET criteria area of
involved extends from the carotid bifurcation and measures
approximately 16 mm in length distally, right ICA tortuous but
patent to the skullbase without additional stenosis or other
vascular abnormality.

Left carotid system: Scattered eccentric plaque within the left
common carotid artery without flow-limiting stenosis. Calcified and
noncalcified plaque at the left carotid bifurcation/proximal left
ICA with associated severe near occlusive high-grade stenosis
(greater than 90%). A string sign is present (series 12, image 117).
Area involvement extends from the carotid bifurcation in measures
approximately 21 mm in length. Distally, left ICA tortuous but
widely patent to the skullbase without additional stenosis or other
acute vascular abnormality. No made of moderate to severe narrowing
at the origin of the left external carotid artery is well.

Vertebral arteries: Both of the vertebral arteries arise from the
subclavian arteries. Severe stenosis at the origin of the right
vertebral artery (series 11, image 261). More moderate narrowing at
the origin of the left vertebral artery. Left vertebral artery is
dominant. There are additional multifocal moderate to severe
stenoses within the right V1/V2 segments (series 11, image 228,
214). Additional atheromatous irregularity within the hypoplastic
right vertebral artery distally without severe stenosis. Multifocal
plaque present throughout the left V2 and V3 segments with mild to
moderate multifocal narrowing. No high-grade flow-limiting stenosis.

Skeleton: No acute osseous abnormality. No worrisome lytic or
blastic osseous lesions. Moderate multilevel degenerative
spondylolysis noted within the cervical spine. Prominent broad
posterior disc protrusion noted at C4-5 with resultant and least
moderate stenosis.

Other neck: Visualized soft tissues of the neck demonstrate no acute
abnormality. No adenopathy. Thyroid within normal limits.

Upper chest: Few mildly prominent pretracheal lymph nodes measure at
the upper limits of normal at 1 cm in short axis. Mild esophageal
wall thickening, which may be related to reflux disease or possibly
acute esophagitis. Small layering bilateral pleural effusions with
associated atelectatic changes. Patchy opacity within the superior
segment right lower lobe may reflect infiltrate.

Review of the MIP images confirms the above findings

CTA HEAD FINDINGS

Anterior circulation: Petrous segments widely patent bilaterally.
Scattered atheromatous plaque throughout the carotid siphons with
mild to moderate diffuse narrowing. ICA termini widely patent. Left
A1 segment widely patent. Right A1 segment hypoplastic and/ or
absent, likely accounting for the slightly diminutive right ICA is
compared to the left. Anterior communicating artery normal.
Short-segment severe left A2 stenosis noted (series 11, image 8).
ACA is otherwise patent to their distal aspects. For

Short-segment severe proximal left M1 stenosis, extending from the
terminus and measuring 5 mm in length (series 11, image 96). Left M1
segment otherwise widely patent. Small vessel atheromatous
irregularity throughout the left MCA branches.

Right M1 segment irregular with moderate diffuse narrowing distally.
Several proximal moderate to severe right M2 stenoses noted. Small
vessel irregularity throughout the right MCA branches, which are
well perfused and fairly symmetric with the left.

Posterior circulation: Scattered multifocal plaque within the
dominant left vertebral artery as it crosses the dural margin.
Associated mild to moderate multifocal narrowing. Hypoplastic right
vertebral artery irregular and terminates in PICA. Posterior
inferior cerebral arteries themselves are patent proximally. Basilar
artery diminutive and mildly irregular but patent to its distal
aspect without high-grade stenosis. Superior cerebral arteries
grossly patent. Fetal type right PCA supplied via a new right
posterior communicating artery. Left PCA supplied via the basilar
artery. Extensive atheromatous irregularity throughout the P2
segments with multifocal moderate to severe stenoses. Additional
atheromatous irregularity within the distal PCA branches. Right PCA
is not well seen distally, and is attenuated as compared to the
left.

Venous sinuses: Patent. Venous collateralization into the posterior
aspect noted from the proximal right internal jugular vein.

Anatomic variants: No significant anatomic variant. No aneurysm or
vascular malformation.

Delayed phase: No pathologic enhancement.

Review of the MIP images confirms the above findings
IMPRESSION: CTA NECK IMPRESSION:

1. Severe near occlusive stenosis involving the proximal left ICA of
greater than 90%. Stenosis begins at the bifurcation, and measures
approximately 21 mm in length.
2. Multifocal atheromatous stenoses involving the proximal right ICA
of up to 50%. Area affected begins at the right carotid bifurcation,
and measures approximately 16 mm in length.
3. Multifocal severe proximal right vertebral artery stenoses as
above, with more mild to moderate narrowing throughout the left
vertebral artery. Left vertebral artery is dominant.
4. 50% stenosis at the proximal right brachiocephalic artery. No
other high-grade stenosis about the origin of the great vessels.
5. Layering bilateral pleural effusions with associated atelectasis.
Possible superimposed infiltrate within the superior segment of the
right lower lobe.

CTA HEAD IMPRESSION:

1. Extensive atheromatous disease involving the anterior
circulation, with superimposed severe proximal left M1 and left A2
stenoses as above.
2. Extensive atheromatous disease throughout the posterior
circulation as above, most notable within the bilateral PCAs.
Dominant left vertebral artery and basilar artery are patent but
irregular with multifocal stenoses as above. Hypoplastic right
vertebral artery terminates in PICA.

## 2018-10-01 ENCOUNTER — Encounter: Payer: Self-pay | Admitting: Cardiology

## 2018-10-24 ENCOUNTER — Ambulatory Visit: Payer: Medicare Other | Admitting: Cardiology

## 2021-07-30 DEATH — deceased
